# Patient Record
Sex: Female | Born: 1990 | Race: White | Hispanic: No | Marital: Single | Smoking: Former smoker
Health system: Southern US, Community
[De-identification: ages and names within clinical notes are randomized; demographics above are authoritative.]

## PROBLEM LIST (undated history)

## (undated) ENCOUNTER — Inpatient Hospital Stay: Payer: Self-pay

## (undated) DIAGNOSIS — O24419 Gestational diabetes mellitus in pregnancy, unspecified control: Secondary | ICD-10-CM

## (undated) HISTORY — PX: NO PAST SURGERIES: SHX2092

---
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
## 2012-04-14 ENCOUNTER — Observation Stay: Payer: Self-pay

## 2012-04-17 ENCOUNTER — Observation Stay: Payer: Self-pay | Admitting: Obstetrics & Gynecology

## 2012-04-19 ENCOUNTER — Observation Stay: Payer: Self-pay | Admitting: Obstetrics and Gynecology

## 2012-04-21 ENCOUNTER — Inpatient Hospital Stay: Payer: Self-pay | Admitting: Obstetrics and Gynecology

## 2012-04-21 LAB — PIH PROFILE
Anion Gap: 13 (ref 7–16)
Calcium, Total: 9.2 mg/dL (ref 8.5–10.1)
Chloride: 104 mmol/L (ref 98–107)
Co2: 20 mmol/L — ABNORMAL LOW (ref 21–32)
EGFR (African American): >60
HCT: 40.3 % (ref 35.0–47.0)
Osmolality: 271 (ref 275–301)
Potassium: 3.9 mmol/L (ref 3.5–5.1)
RDW: 15.6 % — ABNORMAL HIGH (ref 11.5–14.5)
SGOT(AST): 26 U/L (ref 15–37)
Sodium: 137 mmol/L (ref 136–145)
WBC: 11.5 10*3/uL — ABNORMAL HIGH (ref 3.6–11.0)

## 2012-04-21 LAB — PROTEIN / CREATININE RATIO, URINE
Creatinine, Urine: 166.3 mg/dL — ABNORMAL HIGH (ref 30.0–125.0)
Protein, Random Urine: 28 mg/dL — ABNORMAL HIGH (ref 0–12)

## 2014-07-31 NOTE — H&P (Signed)
L&D Evaluation:  History Expanded:   HPI [age] yo [facility] and 1st trimester US, presenting with contractions since this am. Pt was checked in office yesterday and was dilated 1 cm, membranes stripped. Decreased FM, no LOF, no VB.   [facility]  A+ / VZNI / RPR NR / HIV neg / 1-hr 126 / GBS negative   TDAP given 02/15/2012, influenza given 01/04/2012    Blood Type (Maternal) A positive    Maternal HIV Negative    Maternal Syphilis Ab Nonreactive    Maternal Varicella Non-Immune    Rubella Results (Maternal) immune    Maternal T-Dap Immune    Presents with contractions    Patient's Medical History No Chronic Illness    Patient's Surgical History none    Medications Pre Natal Vitamins    Allergies NKDA    Social History none  former tobacco   ROS:   ROS All systems were reviewed.  HEENT, CNS, GI, GU, Respiratory, CV, Renal and Musculoskeletal systems were found to be normal.   Exam:   Vital Signs 130s/90s, 142/86    General appears uncomfortable with contractions    Mental Status clear    Chest clear    Heart no murmur/gallop/rubs    Abdomen gravid, tender with contractions    Estimated Fetal Weight Large for gestational age, 8-8.5 #    Fetal Position vtx    Edema no edema    Reflexes 2+    Pelvic 2/75/-1 per RN    Mebranes Intact    FHT Baseline 140, min variability, <15x15 accels, no decels    FHT Description Decreased Variability    Ucx regular, q 2-4 minutes    Skin dry   Impression:   Impression early labor   Plan:   Plan monitor contractions and for cervical change    Comments IV Fluids Pre-e & admission labs    Follow Up Appointment already scheduled   Electronic Signatures: Vella KohlerBrothers, Cashmere Harmes K (CNM)  (Signed 30-Jan-14 08:17)  Authored: L&D Evaluation   Last Updated: 30-Jan-14 08:17 by Vella KohlerBrothers, Billijo Dilling K (CNM)

## 2014-07-31 NOTE — H&P (Signed)
L&D Evaluation:  History:   HPI 24 yo [facility] with contractions.  They have been off an on for the last week.  Increased in frequency to every 1 minutes lasting 1 minute per patient.  +FM, no LOF, no VB  [facility]  A+ / VZNI / RPR NR / HIV neg / 1-hr 126 / GBS negative   TDAP given 02/15/2012, influenza given 01/04/2012    Presents with contractions    Patient's Medical History No Chronic Illness    Patient's Surgical History none    Medications Pre Natal Vitamins    Allergies NKDA    Social History none   ROS:   ROS All systems were reviewed.  HEENT, CNS, GI, GU, Respiratory, CV, Renal and Musculoskeletal systems were found to be normal.   Exam:   Vital Signs stable    Urine Protein not completed    General no apparent distress    Chest no increased work of breathing    Abdomen gravid, non-tender    Estimated Fetal Weight Average for gestational age    Fetal Position vtx    Edema no edema    Pelvic no external lesions, 1/50/-3    Mebranes Intact    FHT normal rate with no decels, negative ctx stress test    Ucx irregular, initially ever 2 minutes spaced out to irregular 5-8 min   Impression:   Impression Braxton Hicks Contractions   Plan:   Plan EFM/NST    Comments No cerivcal change over 2-hrs of monitorin, less than 39 weeks so no augmentation.  Given ambien 10mg  tab 1 tab po qhs prn insomnia.  [facility]    Follow Up Appointment already scheduled   Electronic Signatures: Lorrene ReidStaebler, Kasey Hansell M (MD)  (Signed 28-Jan-14 23:59)  Authored: L&D Evaluation   Last Updated: 28-Jan-14 23:59 by Lorrene ReidStaebler, Pepper Kerrick M (MD)

## 2015-04-29 ENCOUNTER — Emergency Department: Payer: No Typology Code available for payment source

## 2015-04-29 ENCOUNTER — Emergency Department
Admission: EM | Admit: 2015-04-29 | Discharge: 2015-04-29 | Disposition: A | Discharge status: Home / Self Care | Payer: No Typology Code available for payment source | Attending: Emergency Medicine | Admitting: Emergency Medicine

## 2015-04-29 ENCOUNTER — Encounter: Payer: Self-pay | Admitting: Emergency Medicine

## 2015-04-29 DIAGNOSIS — F17200 Nicotine dependence, unspecified, uncomplicated: Secondary | ICD-10-CM | POA: Diagnosis not present

## 2015-04-29 DIAGNOSIS — S134XXA Sprain of ligaments of cervical spine, initial encounter: Secondary | ICD-10-CM | POA: Diagnosis not present

## 2015-04-29 DIAGNOSIS — Y92410 Unspecified street and highway as the place of occurrence of the external cause: Secondary | ICD-10-CM | POA: Insufficient documentation

## 2015-04-29 DIAGNOSIS — Y998 Other external cause status: Secondary | ICD-10-CM | POA: Diagnosis not present

## 2015-04-29 DIAGNOSIS — Y9389 Activity, other specified: Secondary | ICD-10-CM | POA: Diagnosis not present

## 2015-04-29 DIAGNOSIS — S199XXA Unspecified injury of neck, initial encounter: Secondary | ICD-10-CM | POA: Diagnosis present

## 2015-04-29 MED ORDER — OXYCODONE-ACETAMINOPHEN 5-325 MG PO TABS
1.0000 | ORAL_TABLET | Freq: Once | ORAL | Status: AC
  Administered 2015-04-29: 1 via ORAL
  Filled 2015-04-29: qty 1
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
## 2015-04-29 NOTE — ED Notes (Signed)
C-collar removed by MD, sister in law at bedside.

## 2015-04-30 ENCOUNTER — Ambulatory Visit
Admission: EM | Admit: 2015-04-30 | Discharge: 2015-04-30 | Disposition: A | Discharge status: Home / Self Care | Payer: Self-pay | Attending: Family Medicine | Admitting: Family Medicine

## 2015-04-30 DIAGNOSIS — M6248 Contracture of muscle, other site: Secondary | ICD-10-CM

## 2015-04-30 DIAGNOSIS — M62838 Other muscle spasm: Secondary | ICD-10-CM

## 2015-04-30 DIAGNOSIS — S134XXA Sprain of ligaments of cervical spine, initial encounter: Secondary | ICD-10-CM

## 2015-04-30 MED ORDER — ONDANSETRON 8 MG PO TBDP: 8.0000 mg | ORAL_TABLET | Freq: Three times a day (TID) | ORAL | Status: DC | PRN

## 2015-04-30 MED ORDER — MELOXICAM 15 MG PO TABS: 15.0000 mg | ORAL_TABLET | Freq: Every day | ORAL | Status: DC

## 2015-04-30 MED ORDER — HYDROCODONE-ACETAMINOPHEN 5-325 MG PO TABS: 1.0000 | ORAL_TABLET | Freq: Three times a day (TID) | ORAL | Status: DC | PRN

## 2015-04-30 MED ORDER — KETOROLAC TROMETHAMINE 60 MG/2ML IM SOLN
60.0000 mg | Freq: Once | INTRAMUSCULAR | Status: AC
  Administered 2015-04-30: 60 mg via INTRAMUSCULAR

## 2015-04-30 MED ORDER — ORPHENADRINE CITRATE ER 100 MG PO TB12: 100.0000 mg | ORAL_TABLET | Freq: Two times a day (BID) | ORAL | Status: DC
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
## 2015-04-30 NOTE — Discharge Instructions (Signed)
Cervical Sprain A cervical sprain is when the tissues (ligaments) that hold the neck bones in place stretch or tear. HOME CARE   Put ice on the injured area.  Put ice in a plastic bag.  Place a towel between your skin and the bag.  Leave the ice on for 15-20 minutes, 3-4 times a day.  You may have been given a collar to wear. This collar keeps your neck from moving while you heal.  Do not take the collar off unless told by your doctor.  If you have long hair, keep it outside of the collar.  Ask your doctor before changing the position of your collar. You may need to change its position over time to make it more comfortable.  If you are allowed to take off the collar for cleaning or bathing, follow your doctor's instructions on how to do it safely.  Keep your collar clean by wiping it with mild soap and water. Dry it completely. If the collar has removable pads, remove them every 1-2 days to hand wash them with soap and water. Allow them to air dry. They should be dry before you wear them in the collar.  Do not drive while wearing the collar.  Only take medicine as told by your doctor.  Keep all doctor visits as told.  Keep all physical therapy visits as told.  Adjust your work station so that you have good posture while you work.  Avoid positions and activities that make your problems worse.  Warm up and stretch before being active. GET HELP IF:  Your pain is not controlled with medicine.  You cannot take less pain medicine over time as planned.  Your activity level does not improve as expected. GET HELP RIGHT AWAY IF:   You are bleeding.  Your stomach is upset.  You have an allergic reaction to your medicine.  You develop new problems that you cannot explain.  You lose feeling (become numb) or you cannot move any part of your body (paralysis).  You have tingling or weakness in any part of your body.  Your symptoms get worse. Symptoms include:  Pain,  soreness, stiffness, puffiness (swelling), or a burning feeling in your neck.  Pain when your neck is touched.  Shoulder or upper back pain.  Limited ability to move your neck.  Headache.  Dizziness.  Your hands or arms feel week, lose feeling, or tingle.  Muscle spasms.  Difficulty swallowing or chewing. MAKE SURE YOU:   Understand these instructions.  Will watch your condition.  Will get help right away if you are not doing well or get worse.   This information is not intended to replace advice given to you by your health care provider. Make sure you discuss any questions you have with your health care provider.   Document Released: 08/26/2007 Document Revised: 11/09/2012 Document Reviewed: 09/14/2012 Elsevier Interactive Patient Education 2016 [place].  Cryotherapy Cryotherapy means treatment with cold. Ice or gel packs can be used to reduce both pain and swelling. Ice is the most helpful within the first 24 to 48 hours after an injury or flare-up from overusing a muscle or joint. Sprains, strains, spasms, burning pain, shooting pain, and aches can all be eased with ice. Ice can also be used when recovering from surgery. Ice is effective, has very few side effects, and is safe for most people to use. PRECAUTIONS  Ice is not a safe treatment option for people with:  Raynaud phenomenon. This is  a condition affecting small blood vessels in the extremities. Exposure to cold may cause your problems to return. °· Cold hypersensitivity. There are many forms of cold hypersensitivity, including: °¨ Cold urticaria. Red, itchy hives appear on the skin when the tissues begin to warm after being iced. °¨ Cold erythema. This is a red, itchy rash caused by exposure to cold. °¨ Cold hemoglobinuria. Red blood cells break down when the tissues begin to warm after being iced. The hemoglobin that carry oxygen are passed into the urine because they cannot combine with blood proteins fast  enough. °· Numbness or altered sensitivity in the area being iced. °If you have any of the following conditions, do not use ice until you have discussed cryotherapy with your caregiver: °· Heart conditions, such as arrhythmia, angina, or chronic heart disease. °· High blood pressure. °· Healing wounds or open skin in the area being iced. °· Current infections. °· Rheumatoid arthritis. °· Poor circulation. °· Diabetes. °Ice slows the blood flow in the region it is applied. This is beneficial when trying to stop inflamed tissues from spreading irritating chemicals to surrounding tissues. However, if you expose your skin to cold temperatures for too long or without the proper protection, you can damage your skin or nerves. Watch for signs of skin damage due to cold. °HOME CARE INSTRUCTIONS °Follow these tips to use ice and cold packs safely. °· Place a dry or damp towel between the ice and skin. A damp towel will cool the skin more quickly, so you may need to shorten the time that the ice is used. °· For a more rapid response, add gentle compression to the ice. °· Ice for no more than 10 to 20 minutes at a time. The bonier the area you are icing, the less time it will take to get the benefits of ice. °· Check your skin after 5 minutes to make sure there are no signs of a poor response to cold or skin damage. °· Rest 20 minutes or more between uses. °· Once your skin is numb, you can end your treatment. You can test numbness by very lightly touching your skin. The touch should be so light that you do not see the skin dimple from the pressure of your fingertip. When using ice, most people will feel these normal sensations in this order: cold, burning, aching, and numbness. °· Do not use ice on someone who cannot communicate their responses to pain, such as small children or people with dementia. °HOW TO MAKE AN ICE PACK °Ice packs are the most common way to use ice therapy. Other methods include ice massage, ice baths,  and cryosprays. Muscle creams that cause a cold, tingly feeling do not offer the same benefits that ice offers and should not be used as a substitute unless recommended by your caregiver. °To make an ice pack, do one of the following: °· Place crushed ice or a bag of frozen vegetables in a sealable plastic bag. Squeeze out the excess air. Place this bag inside another plastic bag. Slide the bag into a pillowcase or place a damp towel between your skin and the bag. °· Mix 3 parts water with 1 part rubbing alcohol. Freeze the mixture in a sealable plastic bag. When you remove the mixture from the freezer, it will be slushy. Squeeze out the excess air. Place this bag inside another plastic bag. Slide the bag into a pillowcase or place a damp towel between your skin and the bag. °SEEK   MEDICAL CARE IF:  You develop white spots on your skin. This may give the skin a blotchy (mottled) appearance.  Your skin turns blue or pale.  Your skin becomes waxy or hard.  Your swelling gets worse. MAKE SURE YOU:   Understand these instructions.  Will watch your condition.  Will get help right away if you are not doing well or get worse.   This information is not intended to replace advice given to you by your health care provider. Make sure you discuss any questions you have with your health care provider.   Document Released: 11/03/2010 Document Revised: 03/30/2014 Document Reviewed: 11/03/2010 Elsevier Interactive Patient Education 2016 [employer].  Acute Torticollis Torticollis is a condition in which the muscles of the neck tighten (contract) abnormally, causing the neck to twist and the head to move into an unnatural position. Torticollis that develops suddenly is called acute torticollis. If torticollis becomes chronic and is left untreated, the face and neck can become deformed. CAUSES This condition may be caused by:  Sleeping in an awkward position (common).  Extending or twisting the neck  muscles beyond their normal position.  Infection. In some cases, the cause may not be known. SYMPTOMS Symptoms of this condition include:  An unnatural position of the head.  Neck pain.  A limited ability to move the neck.  Twisting of the neck to one side. DIAGNOSIS This condition is diagnosed with a physical exam. You may also have imaging tests, such as an X-ray, CT scan, or MRI. TREATMENT Treatment for this condition involves trying to relax the neck muscles. It may include:  Medicines or shots.  Physical therapy.  Surgery. This may be done in severe cases. HOME CARE INSTRUCTIONS  Take medicines only as directed by your health care provider.  Do stretching exercises and massage your neck as directed by your health care provider.  Keep all follow-up visits as directed by your health care provider. This is important. SEEK MEDICAL CARE IF:  You develop a fever. SEEK IMMEDIATE MEDICAL CARE IF:  You develop difficulty breathing.  You develop noisy breathing (stridor).  You start drooling.  You have trouble swallowing or have pain with swallowing.  You develop numbness or weakness in your hands or feet.  You have changes in your speech, understanding, or vision.  Your pain gets worse.   This information is not intended to replace advice given to you by your health care provider. Make sure you discuss any questions you have with your health care provider.   Document Released: 03/06/2000 Document Revised: 07/24/2014 Document Reviewed: 03/05/2014 Elsevier Interactive Patient Education 2016 [employer].  Tourist information centre manager After a car crash (motor vehicle collision), it is normal to have bruises and sore muscles. The first 24 hours usually feel the worst. After that, you will likely start to feel better each day. HOME CARE  Put ice on the injured area.  Put ice in a plastic bag.  Place a towel between your skin and the bag.  Leave the ice on for 15-20  minutes, 03-04 times a day.  Drink enough fluids to keep your pee (urine) clear or pale yellow.  Do not drink alcohol.  Take a warm shower or bath 1 or 2 times a day. This helps your sore muscles.  Return to activities as told by your doctor. Be careful when lifting. Lifting can make neck or back pain worse.  Only take medicine as told by your doctor. Do not use aspirin. GET  HELP RIGHT AWAY IF:   Your arms or legs tingle, feel weak, or lose feeling (numbness).  You have headaches that do not get better with medicine.  You have neck pain, especially in the middle of the back of your neck.  You cannot control when you pee (urinate) or poop (bowel movement).  Pain is getting worse in any part of your body.  You are short of breath, dizzy, or pass out (faint).  You have chest pain.  You feel sick to your stomach (nauseous), throw up (vomit), or sweat.  You have belly (abdominal) pain that gets worse.  There is blood in your pee, poop, or throw up.  You have pain in your shoulder (shoulder strap areas).  Your problems are getting worse. MAKE SURE YOU:   Understand these instructions.  Will watch your condition.  Will get help right away if you are not doing well or get worse.   This information is not intended to replace advice given to you by your health care provider. Make sure you discuss any questions you have with your health care provider.   Document Released: 08/26/2007 Document Revised: 06/01/2011 Document Reviewed: 08/06/2010 Elsevier Interactive Patient Education [employer]

## 2015-05-05 ENCOUNTER — Emergency Department
Admission: EM | Admit: 2015-05-05 | Discharge: 2015-05-05 | Disposition: A | Discharge status: Home / Self Care | Payer: No Typology Code available for payment source | Attending: Emergency Medicine | Admitting: Emergency Medicine

## 2015-05-05 ENCOUNTER — Emergency Department: Payer: No Typology Code available for payment source

## 2015-05-05 DIAGNOSIS — Z791 Long term (current) use of non-steroidal anti-inflammatories (NSAID): Secondary | ICD-10-CM | POA: Insufficient documentation

## 2015-05-05 DIAGNOSIS — Z79899 Other long term (current) drug therapy: Secondary | ICD-10-CM | POA: Insufficient documentation

## 2015-05-05 DIAGNOSIS — S161XXA Strain of muscle, fascia and tendon at neck level, initial encounter: Secondary | ICD-10-CM | POA: Diagnosis not present

## 2015-05-05 DIAGNOSIS — S0990XA Unspecified injury of head, initial encounter: Secondary | ICD-10-CM | POA: Diagnosis not present

## 2015-05-05 DIAGNOSIS — Y92410 Unspecified street and highway as the place of occurrence of the external cause: Secondary | ICD-10-CM | POA: Insufficient documentation

## 2015-05-05 DIAGNOSIS — Y9389 Activity, other specified: Secondary | ICD-10-CM | POA: Insufficient documentation

## 2015-05-05 DIAGNOSIS — F17200 Nicotine dependence, unspecified, uncomplicated: Secondary | ICD-10-CM | POA: Insufficient documentation

## 2015-05-05 DIAGNOSIS — S199XXA Unspecified injury of neck, initial encounter: Secondary | ICD-10-CM | POA: Diagnosis present

## 2015-05-05 DIAGNOSIS — Y998 Other external cause status: Secondary | ICD-10-CM | POA: Diagnosis not present

## 2015-05-05 MED ORDER — OXYCODONE-ACETAMINOPHEN 5-325 MG PO TABS
1.0000 | ORAL_TABLET | Freq: Once | ORAL | Status: AC
  Administered 2015-05-05: 1 via ORAL
  Filled 2015-05-05: qty 1

## 2015-05-05 MED ORDER — OXYCODONE-ACETAMINOPHEN 5-325 MG PO TABS: 1.0000 | ORAL_TABLET | Freq: Four times a day (QID) | ORAL | Status: DC | PRN

## 2015-05-05 NOTE — ED Notes (Signed)
Pt taken to CT.

## 2015-05-05 NOTE — Discharge Instructions (Signed)
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
## 2015-05-05 NOTE — ED Notes (Signed)
Report to Rachel, RN.
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
## 2015-07-11 ENCOUNTER — Encounter: Payer: Self-pay | Admitting: Gynecology

## 2015-07-11 ENCOUNTER — Ambulatory Visit
Admission: EM | Admit: 2015-07-11 | Discharge: 2015-07-11 | Disposition: A | Discharge status: Home / Self Care | Payer: No Typology Code available for payment source | Attending: Family Medicine | Admitting: Family Medicine

## 2015-07-11 DIAGNOSIS — H109 Unspecified conjunctivitis: Secondary | ICD-10-CM

## 2015-07-11 DIAGNOSIS — H05012 Cellulitis of left orbit: Secondary | ICD-10-CM

## 2015-07-11 DIAGNOSIS — J0100 Acute maxillary sinusitis, unspecified: Secondary | ICD-10-CM

## 2015-07-11 LAB — RAPID STREP SCREEN (MED CTR MEBANE ONLY): Streptococcus, Group A Screen (Direct): NEGATIVE

## 2015-07-11 MED ORDER — SUPRAX 400 MG PO TABS: 400.0000 mg | ORAL_TABLET | Freq: Every day | ORAL | Status: DC

## 2015-07-11 MED ORDER — HYDROCOD POLST-CPM POLST ER 10-8 MG/5ML PO SUER: 5.0000 mL | Freq: Two times a day (BID) | ORAL | Status: DC | PRN

## 2015-07-11 MED ORDER — FEXOFENADINE-PSEUDOEPHED ER 180-240 MG PO TB24: 1.0000 | ORAL_TABLET | Freq: Every day | ORAL | Status: DC

## 2015-07-11 MED ORDER — CEFTRIAXONE SODIUM 1 G IJ SOLR
1.0000 g | Freq: Once | INTRAMUSCULAR | Status: AC
  Administered 2015-07-11: 1 g via INTRAMUSCULAR

## 2015-07-11 NOTE — ED Provider Notes (Signed)
CSN: [id number]     Arrival date & time 07/11/15  1952 History   First MD Initiated Contact with Patient 07/11/15 2057    Nurses notes were reviewed.  Chief Complaint  Patient presents with  . Sore Throat  . URI  . Conjunctivitis   Patient is here because of coughing for 2 weeks sinus congestion and nasal drainage. She also reports having sore throat for about a week. She states her sputum is also productive greenish as per chronic problem. She also reports left eye she will this morning with a swollen red inflamed itchy. She does not remember or recall anything getting into her eye and our spine last night before she went to bed.  She does not have any drug allergies unfortunately she does smoke and she was warned that she needs to stop smoking. There is a history of both grandparents having breast cancer. No other significant or pertinent medical family history.  (Consider location/radiation/quality/duration/timing/severity/associated sxs/prior Treatment) Patient is a 25 y.o. female presenting with pharyngitis, URI, and conjunctivitis. The history is provided by the patient. No language interpreter was used.  Sore Throat This is a new problem. The current episode started more than 1 week ago. Associated symptoms include shortness of breath. Nothing aggravates the symptoms. Nothing relieves the symptoms. She has tried nothing for the symptoms. The treatment provided no relief.  URI Presenting symptoms: congestion, cough and rhinorrhea   Congestion:    Location:  Chest and nasal Severity:  Moderate Duration:  2 weeks Timing:  Constant Relieved by:  Nothing Associated symptoms: sinus pain   Conjunctivitis This is a new problem. The current episode started 6 to 12 hours ago. The problem occurs constantly. Associated symptoms include shortness of breath. Nothing aggravates the symptoms. She has tried nothing for the symptoms. The treatment provided no relief.    History reviewed. No  pertinent past medical history. History reviewed. No pertinent past surgical history. No family history on file. Social History  Substance Use Topics  . Smoking status: Current Every Day Smoker -- 0.50 packs/day    Types: Cigarettes  . Smokeless tobacco: None  . Alcohol Use: Yes     Comment: socially   OB History    No data available     Review of Systems  HENT: Positive for congestion and rhinorrhea.   Eyes: Positive for pain, discharge and itching.  Respiratory: Positive for cough and shortness of breath.   Skin: Negative for rash.  All other systems reviewed and are negative.   Allergies  Review of patient's allergies indicates no known allergies.  Home Medications   Prior to Admission medications   Medication Sig Start Date End Date Taking? Authorizing Provider  chlorpheniramine-HYDROcodone (TUSSIONEX PENNKINETIC ER) 10-8 MG/5ML SUER Take 5 mLs by mouth every 12 (twelve) hours as needed for cough. 07/11/15   Hassan RowanEugene Erisa Mehlman, MD  fexofenadine-pseudoephedrine (ALLEGRA-D ALLERGY & CONGESTION) 180-240 MG 24 hr tablet Take 1 tablet by mouth daily. 07/11/15   Hassan RowanEugene Hara Milholland, MD  HYDROcodone-acetaminophen (NORCO) 5-325 MG tablet Take 1 tablet by mouth every 8 (eight) hours as needed for moderate pain. 04/30/15   Hassan RowanEugene Maegen Wigle, MD  ibuprofen (ADVIL,MOTRIN) 800 MG tablet Take 800 mg by mouth every 8 (eight) hours as needed.    Historical Provider, MD  levonorgestrel (MIRENA) 20 MCG/24HR IUD 1 each by Intrauterine route once.    Historical Provider, MD  meloxicam (MOBIC) 15 MG tablet Take 1 tablet (15 mg total) by mouth daily. 04/30/15   Hassan RowanEugene Augusto Deckman,  MD  ondansetron (ZOFRAN ODT) 8 MG disintegrating tablet Take 1 tablet (8 mg total) by mouth every 8 (eight) hours as needed for nausea or vomiting. 04/30/15   Hassan Rowan, MD  orphenadrine (NORFLEX) 100 MG tablet Take 1 tablet (100 mg total) by mouth 2 (two) times daily. 04/30/15   Hassan Rowan, MD  oxyCODONE-acetaminophen (ROXICET) 5-325 MG tablet Take 1  tablet by mouth every 6 (six) hours as needed. 05/05/15   Minna Antis, MD  SUPRAX 400 MG tablet Take 1 tablet (400 mg total) by mouth daily. If less expensive for better coverage may take 200 mg 2 of the chewable tablets for 10 days #20 07/11/15   Hassan Rowan, MD   Meds Ordered and Administered this Visit   Medications  cefTRIAXone (ROCEPHIN) injection 1 g (1 g Intramuscular Given 07/11/15 2125)    BP 125/99 mmHg  Pulse 93  Temp(Src) 98.6 F (37 C) (Oral)  Resp 16  Ht  (1.549 m)  Wt 180 lb (81.647 kg)  BMI 34.03 kg/m2  SpO2 100%  LMP  No data found.   Physical Exam  Constitutional: She is oriented to person, place, and time. She appears well-developed and well-nourished.  HENT:  Head: Normocephalic and atraumatic.  Right Ear: External ear normal.  Left Ear: External ear normal.  Eyes: Conjunctivae are normal. Pupils are equal, round, and reactive to light. Right eye exhibits no discharge.  Neck: Normal range of motion. Neck supple.  Cardiovascular: Normal rate and regular rhythm.   Pulmonary/Chest: Effort normal.  Musculoskeletal: Normal range of motion. She exhibits no tenderness.  Lymphadenopathy:    She has cervical adenopathy.  Neurological: She is alert and oriented to person, place, and time.  Skin: Skin is warm.  Psychiatric: She has a normal mood and affect. Her behavior is normal.  Vitals reviewed.   ED Course  Procedures (including critical care time)  Labs Review Labs Reviewed  RAPID STREP SCREEN (NOT AT [facility])  CULTURE, GROUP A STREP [facility])    Imaging Review No results found.   Visual Acuity Review  Right Eye Distance:   Left Eye Distance:   Bilateral Distance:    Right Eye Near: R Near: 20/20 Left Eye Near:  L Near: 20/40 Bilateral Near:  20/20 (without corrective)   Results for orders placed or performed during the hospital encounter of 07/11/15  Rapid strep screen  Result Value Ref Range   Streptococcus, Group A Screen (Direct)  NEGATIVE NEGATIVE     MDM   1. Orbital cellulitis on left   2. Acute maxillary sinusitis, recurrence not specified   3. Conjunctivitis of left eye    We'll place on Suprax 400 mg 1 tablet a day, Allegra-D 1 tablet daily Flonase nasal spray 2 puffs each nostril daily as well. We'll recommend she follow up with her PCP in one week to 10 days. Follow-up here in 48 hours or go to the ED if fever occurs or symptoms become worse. Explained to her the importance of treating orbital cellulitis and take serious. Will give her a gram of Rocephin here tonight.  Note: This dictation was prepared with Dragon dictation along with smaller phrase technology. Any transcriptional errors that result from this process are unintentional.  Hassan Rowan, MD 07/11/15 2137

## 2015-07-11 NOTE — ED Notes (Signed)
Patient c/o left eye conjunctivitis x this am. Patient also stated coughing/ nasal congestion /drip x 1 week.

## 2015-07-11 NOTE — Discharge Instructions (Signed)
Orbital Cellulitis Orbital cellulitis is an infection in the eye socket (orbit) and the tissues that surround the eye. The infection can spread to the eyelids, eyebrow area, and cheek. It can also cause a pocket of pus to develop around the eye (orbital abscess). In severe cases, the infection can spread to the brain. Orbital cellulitis is a medical emergency. CAUSES The most common cause of this condition is a bacterial infection. The infection usually spreads to the eye socket from another part of the body. The infection may start in:  The nose or sinuses.  The eyelids.  Facial skin.  The bloodstream. RISK FACTORS This condition is more likely to develop in people who have recently had one of the following:  Upper respiratory infection.  Sinus infection.  Eyelid or facial infection.  Eye injury.  Infection that affects the entire body or the bloodstream (systemic infection). SYMPTOMS Symptoms of this condition usually start quickly. Symptoms include:  Eye pain that gets worse with eye movement.  Swelling around the eye.  Eye redness.  Bulging of the eye.  Inability to move the eye.  Double vision.  Fever. DIAGNOSIS This condition may be diagnosed based on your symptoms and an eye exam. You may also have tests to confirm the diagnosis and to check for an orbital abscess. Other tests (cultures) may be done to find out what type of bacteria is causing the infection. Tests may include:  Complete blood count (CBC).  Blood culture.  Nose, sinus, or throat culture.  Imaging studies such as a CT scan or MRI. TREATMENT This condition is usually treated in a hospital. Antibiotic medicines are given directly into a vein through an IV tube.  At first, you may get IV antibiotics to kill bacteria that often cause orbital cellulitis (broad spectrum antibiotics).  Your medicine may be changed if cultures suggest that another antibiotic would be better.  If the IV  antibiotics are working to treat your infection, you may be switched to oral antibiotics and allowed to go home.  In some cases, surgery may be needed to drain an orbital abscess. HOME CARE INSTRUCTIONS  Take medicines only as directed by your health care provider.  Take your antibiotic medicine as directed by your health care provider. Finish the antibiotic even if you start to feel better.  Return to your normal activities as directed by your health care provider. Ask your health care provider what activities are safe for you.  Keep all follow-up visits as directed by your health care provider. This is important. SEEK IMMEDIATE MEDICAL CARE IF:  Your eye pain or swelling returns or it gets worse.  You have any changes in your vision.  You have a fever.   This information is not intended to replace advice given to you by your health care provider. Make sure you discuss any questions you have with your health care provider.   Document Released: 03/03/2001 Document Revised: 07/24/2014 Document Reviewed: 03/05/2014 Elsevier Interactive Patient Education 2016 Elsevier Inc.  Sinusitis, Adult Sinusitis is redness, soreness, and puffiness (inflammation) of the air pockets in the bones of your face (sinuses). The redness, soreness, and puffiness can cause air and mucus to get trapped in your sinuses. This can allow germs to grow and cause an infection.  HOME CARE   Drink enough fluids to keep your pee (urine) clear or pale yellow.  Use a humidifier in your home.  Run a hot shower to create steam in the bathroom. Sit in the bathroom  with the door closed. Breathe in the steam 3-4 times a day.  Put a warm, moist washcloth on your face 3-4 times a day, or as told by your doctor.  Use salt water sprays (saline sprays) to wet the thick fluid in your nose. This can help the sinuses drain.  Only take medicine as told by your doctor. GET HELP RIGHT AWAY IF:   Your pain gets worse.  You  have very bad headaches.  You are sick to your stomach (nauseous).  You throw up (vomit).  You are very sleepy (drowsy) all the time.  Your face is puffy (swollen).  Your vision changes.  You have a stiff neck.  You have trouble breathing. MAKE SURE YOU:   Understand these instructions.  Will watch your condition.  Will get help right away if you are not doing well or get worse.   This information is not intended to replace advice given to you by your health care provider. Make sure you discuss any questions you have with your health care provider.   Document Released: 08/26/2007 Document Revised: 03/30/2014 Document Reviewed: 10/13/2011 Elsevier Interactive Patient Education [employer] Elsevier Inc.

## 2015-07-12 ENCOUNTER — Telehealth: Payer: Self-pay | Admitting: Emergency Medicine

## 2015-07-12 NOTE — ED Notes (Signed)
Entry on 07/12/15: Pt called, stated she could not afford Suprex prescription, she did not have health insurance, and asked for less expensive medication. Per Dr. Thurmond ButtsWade, prescription for Augmentin 875 mg, 2x day, for 10 days.

## 2015-07-13 LAB — CULTURE, GROUP A STREP (THRC)

## 2015-07-16 ENCOUNTER — Telehealth: Payer: Self-pay | Admitting: *Deleted

## 2015-07-16 NOTE — ED Notes (Signed)
Called patient, significant other answered. Informed him that patient strep culture came back negative, but to continue taking antibiotic.

## 2015-07-26 ENCOUNTER — Ambulatory Visit
Admission: EM | Admit: 2015-07-26 | Discharge: 2015-07-26 | Disposition: A | Discharge status: Home / Self Care | Payer: No Typology Code available for payment source | Attending: Family Medicine | Admitting: Family Medicine

## 2015-07-26 ENCOUNTER — Encounter: Payer: Self-pay | Admitting: Emergency Medicine

## 2015-07-26 DIAGNOSIS — H109 Unspecified conjunctivitis: Secondary | ICD-10-CM

## 2015-07-26 DIAGNOSIS — R05 Cough: Secondary | ICD-10-CM

## 2015-07-26 DIAGNOSIS — R059 Cough, unspecified: Secondary | ICD-10-CM

## 2015-07-26 MED ORDER — GUAIFENESIN-CODEINE 100-10 MG/5ML PO SOLN: ORAL | Status: DC

## 2015-07-26 MED ORDER — MOXIFLOXACIN HCL 0.5 % OP SOLN: 1.0000 [drp] | Freq: Three times a day (TID) | OPHTHALMIC | Status: DC

## 2015-07-26 NOTE — ED Provider Notes (Signed)
CSN: [id number]     Arrival date & time 07/26/15  1158 History   First MD Initiated Contact with Patient 07/26/15 1327     Chief Complaint  Patient presents with  . Cough  . Eye Problem   (Consider location/radiation/quality/duration/timing/severity/associated sxs/prior Treatment) HPI Comments: 25 yo female with a c/o 2 days of eye drainage and redness. Also c/o cough worse at night. Denies any fevers, chills, sore throat, nasal congestion, wheezing, chest pain, shortness of breath. Was treated with Augmentin for sinusitis about 3 weeks ago.   The history is provided by the patient.    History reviewed. No pertinent past medical history. History reviewed. No pertinent past surgical history. History reviewed. No pertinent family history. Social History  Substance Use Topics  . Smoking status: Current Every Day Smoker -- 0.50 packs/day    Types: Cigarettes  . Smokeless tobacco: None  . Alcohol Use: Yes     Comment: socially   OB History    No data available     Review of Systems  Allergies  Review of patient's allergies indicates no known allergies.  Home Medications   Prior to Admission medications   Medication Sig Start Date End Date Taking? Authorizing Provider  chlorpheniramine-HYDROcodone (TUSSIONEX PENNKINETIC ER) 10-8 MG/5ML SUER Take 5 mLs by mouth every 12 (twelve) hours as needed for cough. 07/11/15   Hassan RowanEugene Wade, MD  fexofenadine-pseudoephedrine (ALLEGRA-D ALLERGY & CONGESTION) 180-240 MG 24 hr tablet Take 1 tablet by mouth daily. 07/11/15   Hassan RowanEugene Wade, MD  guaiFENesin-codeine 100-10 MG/5ML syrup 10 ml po qhs prn 07/26/15   Payton Mccallumrlando Daily Crate, MD  HYDROcodone-acetaminophen (NORCO) 5-325 MG tablet Take 1 tablet by mouth every 8 (eight) hours as needed for moderate pain. 04/30/15   Hassan RowanEugene Wade, MD  ibuprofen (ADVIL,MOTRIN) 800 MG tablet Take 800 mg by mouth every 8 (eight) hours as needed.    Historical Provider, MD  levonorgestrel (MIRENA) 20 MCG/24HR IUD 1 each by  Intrauterine route once.    Historical Provider, MD  meloxicam (MOBIC) 15 MG tablet Take 1 tablet (15 mg total) by mouth daily. 04/30/15   Hassan RowanEugene Wade, MD  moxifloxacin (VIGAMOX) 0.5 % ophthalmic solution Place 1 drop into both eyes 3 (three) times daily. 07/26/15   Payton Mccallumrlando Bahja Bence, MD  ondansetron (ZOFRAN ODT) 8 MG disintegrating tablet Take 1 tablet (8 mg total) by mouth every 8 (eight) hours as needed for nausea or vomiting. 04/30/15   Hassan RowanEugene Wade, MD  orphenadrine (NORFLEX) 100 MG tablet Take 1 tablet (100 mg total) by mouth 2 (two) times daily. 04/30/15   Hassan RowanEugene Wade, MD  oxyCODONE-acetaminophen (ROXICET) 5-325 MG tablet Take 1 tablet by mouth every 6 (six) hours as needed. 05/05/15   Minna AntisKevin Paduchowski, MD  SUPRAX 400 MG tablet Take 1 tablet (400 mg total) by mouth daily. If less expensive for better coverage may take 200 mg 2 of the chewable tablets for 10 days #20 07/11/15   Hassan RowanEugene Wade, MD   Meds Ordered and Administered this Visit  Medications - No data to display  BP 122/76 mmHg  Pulse 90  Temp(Src) 97 F (36.1 C) (Tympanic)  Resp 16  Ht 5\' 1"  (1.549 m)  Wt 185 lb (83.915 kg)  BMI 34.97 kg/m2  SpO2 98%  LMP 07/25/2015 (Approximate) No data found.   Physical Exam  Constitutional: She appears well-developed and well-nourished. No distress.  HENT:  Head: Normocephalic and atraumatic.  Right Ear: Tympanic membrane, external ear and ear canal normal.  Left Ear: Tympanic membrane,  external ear and ear canal normal.  Nose: No rhinorrhea, nose lacerations, sinus tenderness, nasal deformity, septal deviation or nasal septal hematoma. No epistaxis.  No foreign bodies.  Mouth/Throat: Uvula is midline, oropharynx is clear and moist and mucous membranes are normal. No oropharyngeal exudate.  Eyes: EOM are normal. Pupils are equal, round, and reactive to light. Right eye exhibits discharge. Left eye exhibits discharge. Right conjunctiva is injected. Left conjunctiva is injected. No scleral icterus.   Neck: Normal range of motion. Neck supple. No thyromegaly present.  Cardiovascular: Normal rate, regular rhythm and normal heart sounds.   Pulmonary/Chest: Effort normal and breath sounds normal. No respiratory distress. She has no wheezes. She has no rales.  Lymphadenopathy:    She has no cervical adenopathy.  Neurological: She is alert.  Skin: She is not diaphoretic.  Nursing note and vitals reviewed.   ED Course  Procedures (including critical care time)  Labs Review Labs Reviewed - No data to display  Imaging Review No results found.   Visual Acuity Review  Right Eye Distance: 20/100 ucorrected Left Eye Distance: 20/50 uncorrected Bilateral Distance:    Right Eye Near:   Left Eye Near:    Bilateral Near:         MDM   1. Bilateral conjunctivitis   2. Cough     Discharge Medication List as of 07/26/2015  1:46 PM    START taking these medications   Details  guaiFENesin-codeine 100-10 MG/5ML syrup 10 ml po qhs prn, Print    moxifloxacin (VIGAMOX) 0.5 % ophthalmic solution Place 1 drop into both eyes 3 (three) times daily., Starting 07/26/2015, Until Discontinued, Normal       1. diagnosis reviewed with patient 2. rx as per orders above; reviewed possible side effects, interactions, risks and benefits  3. Recommend supportive treatment with rest, increased fluids 4. Follow-up prn if symptoms worsen or don't improve    Payton Mccallum, MD 07/26/15 1357

## 2015-07-26 NOTE — ED Notes (Signed)
Patient c/o redness, watery drainage and vision changes in both eye that started yesterday.  Patient also c/o cough and chest congestion for a week.

## 2015-09-26 ENCOUNTER — Emergency Department
Admission: EM | Admit: 2015-09-26 | Discharge: 2015-09-27 | Disposition: A | Discharge status: Other Acute Inpatient Hospital | Payer: Medicaid Other | Attending: Emergency Medicine | Admitting: Emergency Medicine

## 2015-09-26 ENCOUNTER — Emergency Department: Payer: Medicaid Other

## 2015-09-26 DIAGNOSIS — O99331 Smoking (tobacco) complicating pregnancy, first trimester: Secondary | ICD-10-CM | POA: Insufficient documentation

## 2015-09-26 DIAGNOSIS — Z79899 Other long term (current) drug therapy: Secondary | ICD-10-CM | POA: Diagnosis not present

## 2015-09-26 DIAGNOSIS — Z3A09 9 weeks gestation of pregnancy: Secondary | ICD-10-CM | POA: Insufficient documentation

## 2015-09-26 DIAGNOSIS — O99511 Diseases of the respiratory system complicating pregnancy, first trimester: Secondary | ICD-10-CM | POA: Diagnosis not present

## 2015-09-26 DIAGNOSIS — F17210 Nicotine dependence, cigarettes, uncomplicated: Secondary | ICD-10-CM | POA: Diagnosis not present

## 2015-09-26 DIAGNOSIS — R079 Chest pain, unspecified: Secondary | ICD-10-CM

## 2015-09-26 DIAGNOSIS — O26891 Other specified pregnancy related conditions, first trimester: Secondary | ICD-10-CM | POA: Diagnosis present

## 2015-09-26 DIAGNOSIS — R0602 Shortness of breath: Secondary | ICD-10-CM | POA: Diagnosis not present

## 2015-09-26 DIAGNOSIS — R0600 Dyspnea, unspecified: Secondary | ICD-10-CM | POA: Insufficient documentation

## 2015-09-26 DIAGNOSIS — R0789 Other chest pain: Secondary | ICD-10-CM | POA: Diagnosis not present

## 2015-09-26 LAB — BASIC METABOLIC PANEL
ANION GAP: 11 (ref 5–15)
BUN: 11 mg/dL (ref 6–20)
CO2: 20 mmol/L — ABNORMAL LOW (ref 22–32)
Calcium: 9.1 mg/dL (ref 8.9–10.3)
Chloride: 104 mmol/L (ref 101–111)
Creatinine, Ser: 0.65 mg/dL (ref 0.44–1.00)
Glucose, Bld: 99 mg/dL (ref 65–99)
POTASSIUM: 3 mmol/L — AB (ref 3.5–5.1)
SODIUM: 135 mmol/L (ref 135–145)

## 2015-09-26 LAB — HEPATIC FUNCTION PANEL
ALK PHOS: 55 U/L (ref 38–126)
ALT: 37 U/L (ref 14–54)
AST: 30 U/L (ref 15–41)
Albumin: 4 g/dL (ref 3.5–5.0)
BILIRUBIN INDIRECT: 0.2 mg/dL — AB (ref 0.3–0.9)
BILIRUBIN TOTAL: 0.3 mg/dL (ref 0.3–1.2)
Bilirubin, Direct: 0.1 mg/dL (ref 0.1–0.5)
TOTAL PROTEIN: 7.2 g/dL (ref 6.5–8.1)

## 2015-09-26 LAB — TROPONIN I: TROPONIN I: 0.04 ng/mL — AB (ref <0.03)

## 2015-09-26 LAB — CBC
HCT: 43 % (ref 35.0–47.0)
HEMOGLOBIN: 14.6 g/dL (ref 12.0–16.0)
MCH: 29.4 pg (ref 26.0–34.0)
MCHC: 33.9 g/dL (ref 32.0–36.0)
MCV: 86.6 fL (ref 80.0–100.0)
Platelets: 206 10*3/uL (ref 150–440)
RBC: 4.97 MIL/uL (ref 3.80–5.20)
RDW: 13.2 % (ref 11.5–14.5)
WBC: 13.2 10*3/uL — AB (ref 3.6–11.0)

## 2015-09-26 LAB — HCG, QUANTITATIVE, PREGNANCY: hCG, Beta Chain, Quant, S: 39860 m[IU]/mL — ABNORMAL HIGH (ref <5)

## 2015-09-26 LAB — LIPASE, BLOOD: Lipase: 22 U/L (ref 11–51)

## 2015-09-26 MED ORDER — MORPHINE SULFATE (PF) 4 MG/ML IV SOLN
INTRAVENOUS | Status: AC
  Filled 2015-09-26: qty 1

## 2015-09-26 MED ORDER — ONDANSETRON HCL 4 MG/2ML IJ SOLN
INTRAMUSCULAR | Status: AC
  Filled 2015-09-26: qty 2

## 2015-09-26 MED ORDER — MORPHINE SULFATE (PF) 4 MG/ML IV SOLN
4.0000 mg | Freq: Once | INTRAVENOUS | Status: AC
  Administered 2015-09-26: 4 mg via INTRAVENOUS

## 2015-09-26 MED ORDER — ALUM & MAG HYDROXIDE-SIMETH 200-200-20 MG/5ML PO SUSP
30.0000 mL | Freq: Once | ORAL | Status: AC
  Administered 2015-09-26: 30 mL via ORAL
  Filled 2015-09-26: qty 30

## 2015-09-26 MED ORDER — IOPAMIDOL (ISOVUE-370) INJECTION 76%
75.0000 mL | Freq: Once | INTRAVENOUS | Status: AC | PRN
  Administered 2015-09-26: 75 mL via INTRAVENOUS
  Filled 2015-09-26: qty 75

## 2015-09-26 MED ORDER — HYDROMORPHONE HCL 1 MG/ML IJ SOLN
1.0000 mg | Freq: Once | INTRAMUSCULAR | Status: AC
  Administered 2015-09-26: 1 mg via INTRAVENOUS
  Filled 2015-09-26: qty 1

## 2015-09-26 MED ORDER — SODIUM CHLORIDE 0.9 % IV BOLUS (SEPSIS)
1000.0000 mL | Freq: Once | INTRAVENOUS | Status: AC
  Administered 2015-09-26: 1000 mL via INTRAVENOUS

## 2015-09-26 MED ORDER — ONDANSETRON HCL 4 MG/2ML IJ SOLN
4.0000 mg | Freq: Once | INTRAMUSCULAR | Status: AC
  Administered 2015-09-26: 4 mg via INTRAVENOUS

## 2015-09-26 NOTE — ED Notes (Signed)
Pt c/o of bilateral chest pain beginning at 9 am this morning with radiation to mid back described as tightness/throbbing, along with nausea, SOB, dizziness/lightheadedness and weakness.

## 2015-09-26 NOTE — ED Notes (Signed)
Pt reports decreased SOB with oxygen administration

## 2015-09-26 NOTE — ED Notes (Signed)
Pt's spouse would like to be called for any change in pt status: Laura [name] [phone]

## 2015-09-26 NOTE — ED Notes (Signed)
Pharmacy called regarding maalox, will send to ED momentarily.

## 2015-09-26 NOTE — ED Notes (Signed)
Pt reports SOB. Pt placed on 2L [place].

## 2015-09-26 NOTE — ED Notes (Signed)
MD at bedside. 

## 2015-09-26 NOTE — ED Provider Notes (Signed)
[facility] Emergency Department Provider Note   ____________________________________________  Time seen: Approximately 720 PM  I have reviewed the triage vital signs and the nursing notes.   HISTORY  Chief Complaint Chest Pain   HPI Laura [name] is a 25 y.o. female without any chronic medical conditions was presenting at [redacted] weeks pregnant with chest pain. Says that the chest pain started this morning and has been worsening. She says that it is radiating through to her back. She says that it is associated with shortness of breath as well as nausea. Denies any vomiting. Denies any vaginal bleeding or discharge. Denies any abdominal pain. Says that she has had a history of indigestion and says that she felt this may have been indigestion but that Tums did not resolve her pain. Denies any cough. Denies any fever. Denies any history of anxiety or panic attacks. Pain is moderate at this time and worsened with deep breathing.   No past medical history on file.  There are no active problems to display for this patient.   No past surgical history on file.  Current Outpatient Rx  Name  Route  Sig  Dispense  Refill  . chlorpheniramine-HYDROcodone (TUSSIONEX PENNKINETIC ER) 10-8 MG/5ML SUER   Oral   Take 5 mLs by mouth every 12 (twelve) hours as needed for cough.   115 mL   0   . fexofenadine-pseudoephedrine (ALLEGRA-D ALLERGY & CONGESTION) 180-240 MG 24 hr tablet   Oral   Take 1 tablet by mouth daily.   30 tablet   0   . guaiFENesin-codeine 100-10 MG/5ML syrup      10 ml po qhs prn   120 mL   0   . HYDROcodone-acetaminophen (NORCO) 5-325 MG tablet   Oral   Take 1 tablet by mouth every 8 (eight) hours as needed for moderate pain.   20 tablet   0   . ibuprofen (ADVIL,MOTRIN) 800 MG tablet   Oral   Take 800 mg by mouth every 8 (eight) hours as needed.         Marland Kitchen levonorgestrel (MIRENA) 20 MCG/24HR IUD   Intrauterine   1 each by Intrauterine  route once.         . meloxicam (MOBIC) 15 MG tablet   Oral   Take 1 tablet (15 mg total) by mouth daily.   30 tablet   1   . moxifloxacin (VIGAMOX) 0.5 % ophthalmic solution   Both Eyes   Place 1 drop into both eyes 3 (three) times daily.   3 mL   0   . ondansetron (ZOFRAN ODT) 8 MG disintegrating tablet   Oral   Take 1 tablet (8 mg total) by mouth every 8 (eight) hours as needed for nausea or vomiting.   20 tablet   0   . orphenadrine (NORFLEX) 100 MG tablet   Oral   Take 1 tablet (100 mg total) by mouth 2 (two) times daily.   60 tablet   0   . oxyCODONE-acetaminophen (ROXICET) 5-325 MG tablet   Oral   Take 1 tablet by mouth every 6 (six) hours as needed.   20 tablet   0   . SUPRAX 400 MG tablet   Oral   Take 1 tablet (400 mg total) by mouth daily. If less expensive for better coverage may take 200 mg 2 of the chewable tablets for 10 days #20   10 tablet   0     Dispense  as written.    Use the following for patient discount on Suprax B ...     Allergies Review of patient's allergies indicates no known allergies.  Family History  Problem Relation Age of Onset  . Cancer Paternal Grandmother   . Cancer Maternal Grandmother     Social History Social History  Substance Use Topics  . Smoking status: Current Every Day Smoker -- 0.50 packs/day    Types: Cigarettes  . Smokeless tobacco: Not on file  . Alcohol Use: Yes     Comment: socially    Review of Systems Constitutional: No fever/chills Eyes: No visual changes. ENT: No sore throat. Cardiovascular: As above Respiratory: As above Gastrointestinal: No abdominal pain.  No nausea, no vomiting.  No diarrhea.  No constipation. Genitourinary: Negative for dysuria. Musculoskeletal: As above Skin: Negative for rash. Neurological: Negative for headaches, focal weakness or numbness.  10-point ROS otherwise negative.  ____________________________________________   PHYSICAL EXAM:  VITAL SIGNS: ED  Triage Vitals  Enc Vitals Group     BP 09/26/15 1853 114/97 mmHg     Pulse Rate 09/26/15 1853 69     Resp 09/26/15 1853 20     Temp 09/26/15 1853 98.2 F (36.8 C)     Temp Source 09/26/15 1853 Oral     SpO2 09/26/15 1853 100 %     Weight 09/26/15 1853 190 lb (86.183 kg)     Height 09/26/15 1853 5\' 1"  (1.549 m)     Head Cir --      Peak Flow --      Pain Score 09/26/15 1853 10     Pain Loc --      Pain Edu? --      Excl. in GC? --     Constitutional: Alert and oriented. Well appearing and in no acute distress. Eyes: Conjunctivae are normal. PERRL. EOMI. Head: Atraumatic. Nose: No congestion/rhinnorhea. Mouth/Throat: Mucous membranes are moist.   Neck: No stridor.   Cardiovascular: Normal rate, regular rhythm. Grossly normal heart sounds.  Chest pain is not reproducible to palpation.  Respiratory: Normal respiratory effort But mildly tachypneic.  No retractions. Lungs CTAB. Gastrointestinal: Soft and nontender. No distention. No abdominal bruits. No CVA tenderness. Musculoskeletal: No lower extremity tenderness nor edema.  No joint effusions. Neurologic:  Normal speech and language. No gross focal neurologic deficits are appreciated.  Skin:  Skin is warm, dry and intact. No rash noted. Psychiatric: Mood and affect are normal. Speech and behavior are normal.  ____________________________________________   LABS (all labs ordered are listed, but only abnormal results are displayed)  Labs Reviewed  BASIC METABOLIC PANEL  CBC  TROPONIN I   ____________________________________________  EKG  ED ECG REPORT I, Schaevitz,  Teena Iraniavid M, the attending physician, personally viewed and interpreted this ECG.   Date: 09/26/2015  EKG Time: 1918  Rate: 56  Rhythm: sinus bradycardia  Axis: Normal axis  Intervals:none  ST&T Change: No ST segment elevation or depression. S1  q3 T3 pattern.  ED ECG REPORT I, Arelia LongestSchaevitz,  David M, the attending physician, personally viewed and  interpreted this ECG.   Date: 09/26/2015  EKG Time: 2047  Rate: 52  Rhythm: Sinus bradycardia  Axis: Normal axis  Intervals:none  ST&T Change: No ST segment elevation or depression. No abnormal T-wave inversion. Persistent S1 every 3 T3 pattern. Poor baseline secondary to the patient's deep respirations.    ____________________________________________  RADIOLOGY  Discussed chest x-ray with patient and she would like try medication all off on  further imaging as of now because of radiation exposure to the baby.      CT Angio Chest PE W/Cm &/Or Wo Cm (Final result) Result time: 09/26/15 21:38:02   Final result by Rad Results In Interface (09/26/15 21:38:02)   Narrative:   CLINICAL DATA: Bilateral chest pain beginning 12 hours ago. Radiation to the back. Shortness of breath. Weakness.  EXAM: CT ANGIOGRAPHY CHEST WITH CONTRAST  TECHNIQUE: Multidetector CT imaging of the chest was performed using the standard protocol during bolus administration of intravenous contrast. Multiplanar CT image reconstructions and MIPs were obtained to evaluate the vascular anatomy.  CONTRAST: 75 cc Isovue 370  COMPARISON: None.  FINDINGS: Pulmonary arterial opacification is excellent. There are no pulmonary emboli.  The aorta appears normal. No coronary artery calcification. Lungs are clear. No pleural or could pericardial fluid. Scans in the upper abdomen are normal. No bone abnormality.  Review of the MIP images confirms the above findings.  IMPRESSION: Normal examination.   Electronically Signed By: Paulina Fusi M.D. On: 09/26/2015 21:38       ___________ _________________________________   PROCEDURES    Procedures CRITICAL CARE Performed by: Arelia Longest   Total critical care time: 35 minutes  Critical care time was exclusive of separately billable procedures and treating other patients.  Critical care was necessary to treat or prevent  imminent or life-threatening deterioration.  Critical care was time spent personally by me on the following activities: development of treatment plan with patient and/or surrogate as well as nursing, discussions with consultants, evaluation of patient's response to treatment, examination of patient, obtaining history from patient or surrogate, ordering and performing treatments and interventions, ordering and review of laboratory studies, ordering and review of radiographic studies, pulse oximetry and re-evaluation of patient's condition.   ____________________________________________   INITIAL IMPRESSION / ASSESSMENT AND PLAN / ED COURSE  Pertinent labs & imaging results that were available during my care of the patient were reviewed by me and considered in my medical decision making (see chart for details).  Patient said that she'll be following up the [facility] side OB/GYN clinic for further care.  ----------------------------------------- 8:59 PM on 09/26/2015 -----------------------------------------  Patient laboratory results from with an elevated troponin. I discussed the case Dr. Allena Katz of radiology who agrees with a CT angiography to rule out pulmonary embolus. I also took the patient's bilateral radial as well as dorsalis pedis pulses and they're present and equal bilaterally.  I explained the risks and benefits of the CT angiography to the patient, who is pregnant at [redacted] weeks, who understands and accepts the risks and is willing to proceed with the CAT scan. Patient now with 10 out of 10 pain. Will dose morphine as well as Zofran.  ----------------------------------------- 10:17 PM on 09/26/2015 -----------------------------------------  At this point I'm awaiting callback from the [facility] transfer center to speak with her cardiologist. I have already spoken to Dr. Welton Flakes of cardiology here at [facility]. We reviewed the CT imaging and he says that the patient will need to be transferred in  case she needs CT surgery which he did not have at [facility]. The patient was found to not have a pulmonary embolus and her scan was read as normal. Therefore, it is very concerning that she could have a coronary artery dissection. She is requesting a second dose of morphine because her pain is still not 10 after the first dose. She is aware of the need to transfer to [facility]. Originally, I called out to [employer]  that they're cardiac team was occupied already in a STEMI and there would have been a delay. The patient as well as the patient's family have been updated as to the CAT scan results as well as the need for transfer understanding and willing to comply.  ----------------------------------------- 11:00 PM on 09/26/2015 -----------------------------------------  Received formal acceptance from the [facility] transfer center. I had previously discussed to the cardiology fellow, Dr. Bobette MoWegermann.  However, Dr. Lawernce PittsWagermann wanted to discuss the case with the OB/GYN service to see if they wanted to take the patient primarily.  In the end, the patient was accepted as an ER to ER transfer and will be evaluated by both the cardiology as well as OB[facility]GYN service in [facility] department [facility]. The patient was updated and is aware of the transfer process. Will be transferred by [facility]. ____________________________________________   FINAL CLINICAL IMPRESSION(S) / ED DIAGNOSES  Chest pain. Shortness of breath.    NEW MEDICATIONS STARTED DURING THIS VISIT:  New Prescriptions   No medications on file     Note:  This document was prepared using Dragon voice recognition software and may include unintentional dictation errors.    Myrna Blazeravid Matthew Schaevitz, MD 09/26/15 [phone]

## 2015-09-27 MED ORDER — ONDANSETRON HCL 4 MG/2ML IJ SOLN
INTRAMUSCULAR | Status: AC
  Filled 2015-09-27: qty 2

## 2015-09-27 MED ORDER — ONDANSETRON HCL 4 MG/2ML IJ SOLN
4.0000 mg | Freq: Once | INTRAMUSCULAR | Status: AC
  Administered 2015-09-27: 4 mg via INTRAVENOUS

## 2015-09-27 MED ORDER — HYDROMORPHONE HCL 1 MG/ML IJ SOLN
1.0000 mg | Freq: Once | INTRAMUSCULAR | Status: AC
  Administered 2015-09-27: 1 mg via INTRAVENOUS

## 2015-09-27 MED ORDER — HYDROMORPHONE HCL 1 MG/ML IJ SOLN
INTRAMUSCULAR | Status: AC
  Filled 2015-09-27: qty 1

## 2015-10-09 LAB — OB RESULTS CONSOLE HEPATITIS B SURFACE ANTIGEN: Hepatitis B Surface Ag: NEGATIVE

## 2015-10-09 LAB — OB RESULTS CONSOLE RUBELLA ANTIBODY, IGM: Rubella: IMMUNE

## 2015-10-09 LAB — OB RESULTS CONSOLE VARICELLA ZOSTER ANTIBODY, IGG: Varicella: UNDETERMINED

## 2015-10-09 LAB — OB RESULTS CONSOLE RPR: RPR: NONREACTIVE

## 2015-10-09 LAB — OB RESULTS CONSOLE HIV ANTIBODY (ROUTINE TESTING): HIV: NONREACTIVE

## 2016-02-04 ENCOUNTER — Emergency Department
Admission: EM | Admit: 2016-02-04 | Discharge: 2016-02-04 | Disposition: A | Discharge status: Home / Self Care | Payer: Medicaid Other | Attending: Emergency Medicine | Admitting: Emergency Medicine

## 2016-02-04 ENCOUNTER — Emergency Department: Payer: Medicaid Other

## 2016-02-04 DIAGNOSIS — I82812 Embolism and thrombosis of superficial veins of left lower extremities: Secondary | ICD-10-CM | POA: Diagnosis not present

## 2016-02-04 DIAGNOSIS — Z3A25 25 weeks gestation of pregnancy: Secondary | ICD-10-CM | POA: Diagnosis not present

## 2016-02-04 DIAGNOSIS — F17210 Nicotine dependence, cigarettes, uncomplicated: Secondary | ICD-10-CM | POA: Insufficient documentation

## 2016-02-04 DIAGNOSIS — O99412 Diseases of the circulatory system complicating pregnancy, second trimester: Secondary | ICD-10-CM | POA: Insufficient documentation

## 2016-02-04 DIAGNOSIS — Z79899 Other long term (current) drug therapy: Secondary | ICD-10-CM | POA: Diagnosis not present

## 2016-02-04 DIAGNOSIS — O99332 Smoking (tobacco) complicating pregnancy, second trimester: Secondary | ICD-10-CM | POA: Insufficient documentation

## 2016-02-04 MED ORDER — MEDICAL COMPRESSION STOCKINGS MISC: 2.0000 [IU] | 0 refills | Status: DC | PRN

## 2016-02-04 NOTE — ED Provider Notes (Signed)
[facility] Emergency Department Provider Note  ____________________________________________  Time seen: Approximately 3:34 PM  I have reviewed the triage vital signs and the nursing notes.   HISTORY  Chief Complaint Leg Pain     HPI Laura [name] is a 25 y.o. female who presents emergency department complaining of pain and a "knot" to the left posterior knee. Patient states that symptoms and lesion has been present for several weeks. Patient states that pain is typically only at nighttime when she lays down. She denies any lower extremity edema accompanying this. She denies any chest pain or shortness of breath. Patient states the area is tender to palpation. She states over the last several days area has become more painful and is now causing pain during the day which is abnormal. She still denies any other symptoms or complaints. Patient is pregnant at [redacted] weeks but denies any other medical history. Patient does have an OB/GYN and is being followed by same. No other complaints at this time. No medications prior to arrival. No history of DVT. Patient is pregnant, does smoke, but no recent surgeries or travels.   History reviewed. No pertinent past medical history.  There are no active problems to display for this patient.   History reviewed. No pertinent surgical history.  Prior to Admission medications   Medication Sig Start Date End Date Taking? Authorizing Provider  chlorpheniramine-HYDROcodone (TUSSIONEX PENNKINETIC ER) 10-8 MG/5ML SUER Take 5 mLs by mouth every 12 (twelve) hours as needed for cough. 07/11/15   Hassan Rowan, MD  Elastic Bandages & Supports (MEDICAL COMPRESSION STOCKINGS) MISC 2 Units by Does not apply route as needed. 02/04/16   Delorise Royals Cuthriell, PA-C  fexofenadine-pseudoephedrine (ALLEGRA-D ALLERGY & CONGESTION) 180-240 MG 24 hr tablet Take 1 tablet by mouth daily. 07/11/15   Hassan Rowan, MD  guaiFENesin-codeine 100-10 MG/5ML syrup 10  ml po qhs prn 07/26/15   Payton Mccallum, MD  HYDROcodone-acetaminophen (NORCO) 5-325 MG tablet Take 1 tablet by mouth every 8 (eight) hours as needed for moderate pain. 04/30/15   Hassan Rowan, MD  ibuprofen (ADVIL,MOTRIN) 800 MG tablet Take 800 mg by mouth every 8 (eight) hours as needed.    Historical Provider, MD  levonorgestrel (MIRENA) 20 MCG/24HR IUD 1 each by Intrauterine route once.    Historical Provider, MD  meloxicam (MOBIC) 15 MG tablet Take 1 tablet (15 mg total) by mouth daily. 04/30/15   Hassan Rowan, MD  moxifloxacin (VIGAMOX) 0.5 % ophthalmic solution Place 1 drop into both eyes 3 (three) times daily. 07/26/15   Payton Mccallum, MD  ondansetron (ZOFRAN ODT) 8 MG disintegrating tablet Take 1 tablet (8 mg total) by mouth every 8 (eight) hours as needed for nausea or vomiting. 04/30/15   Hassan Rowan, MD  orphenadrine (NORFLEX) 100 MG tablet Take 1 tablet (100 mg total) by mouth 2 (two) times daily. 04/30/15   Hassan Rowan, MD  oxyCODONE-acetaminophen (ROXICET) 5-325 MG tablet Take 1 tablet by mouth every 6 (six) hours as needed. 05/05/15   Minna Antis, MD  SUPRAX 400 MG tablet Take 1 tablet (400 mg total) by mouth daily. If less expensive for better coverage may take 200 mg 2 of the chewable tablets for 10 days #20 07/11/15   Hassan Rowan, MD    Allergies Patient has no known allergies.  Family History  Problem Relation Age of Onset  . Cancer Paternal Grandmother   . Cancer Maternal Grandmother     Social History Social History  Substance Use Topics  .  Smoking status: Current Every Day Smoker    Packs/day: 0.50    Types: Cigarettes  . Smokeless tobacco: Never Used  . Alcohol use Yes     Comment: socially     Review of Systems  Constitutional: No fever/chills Cardiovascular: no chest pain. Respiratory: no cough. No SOB. Gastrointestinal: No abdominal pain.  No nausea, no vomiting.  No diarrhea.  No constipation. Genitourinary: Negative for dysuria. No hematuria. No vaginal  bleeding or discharge. Musculoskeletal: Positive for pain to the posterior left knee. Skin: Negative for rash, abrasions, lacerations, ecchymosis. Neurological: Negative for headaches, focal weakness or numbness. 10-point ROS otherwise negative.  ____________________________________________   PHYSICAL EXAM:  VITAL SIGNS: ED Triage Vitals [02/04/16 1256]  Enc Vitals Group     BP 117/72     Pulse Rate 91     Resp 16     Temp 97.3 F (36.3 C)     Temp Source Oral     SpO2 100 %     Weight 205 lb (93 kg)     Height 5\' 1"  (1.549 m)     Head Circumference      Peak Flow      Pain Score 8     Pain Loc      Pain Edu?      Excl. in GC?      Constitutional: Alert and oriented. Well appearing and in no acute distress. Eyes: Conjunctivae are normal. PERRL. EOMI. Head: Atraumatic. Neck: No stridor.    Cardiovascular: Normal rate, regular rhythm. Normal S1 and S2.  Good peripheral circulation. Respiratory: Normal respiratory effort without tachypnea or retractions. Lungs CTAB. Good air entry to the bases with no decreased or absent breath sounds. Gastrointestinal: Bowel sounds 4 quadrants. Soft and nontender to palpation. No guarding or rigidity. No palpable masses. No distention. No CVA tenderness. Musculoskeletal: Full range of motion to all extremities. No gross deformities appreciated. Poor range of motion to left knee. No gross edema or erythema. Patient does have a erythematous lesion to the stereo medial aspect of the knee. Area appears to be possibly varicocele in nature. Dorsalis pedis pulse intact distally. Sensation intact 5 digits distally. Neurologic:  Normal speech and language. No gross focal neurologic deficits are appreciated.  Skin:  Skin is warm, dry and intact. No rash noted. Psychiatric: Mood and affect are normal. Speech and behavior are normal. Patient exhibits appropriate insight and judgement.   ____________________________________________   LABS (all labs  ordered are listed, but only abnormal results are displayed)  Labs Reviewed - No data to display ____________________________________________  EKG   ____________________________________________  RADIOLOGY Festus BarrenI, Jonathan D Cuthriell, personally viewed and evaluated these images (plain radiographs) as part of my medical decision making, as well as reviewing the written report by the radiologist.  [place] Venous Img Lower Unilateral Left  Result Date: 02/04/2016 CLINICAL DATA:  Left lower extremity pain and swelling for 2 days. EXAM: LEFT LOWER EXTREMITY VENOUS DOPPLER ULTRASOUND TECHNIQUE: Gray-scale sonography with graded compression, as well as color Doppler and duplex ultrasound, were performed to evaluate the deep venous system from the level of the common femoral vein through the popliteal and proximal calf veins. Spectral Doppler was utilized to evaluate flow at rest and with distal augmentation maneuvers. COMPARISON:  None. FINDINGS: Right common femoral vein is patent without thrombus. Normal compressibility, augmentation and color Doppler flow in the left common femoral vein, left femoral vein and left popliteal vein. The left saphenofemoral junction is patent. Left profunda femoral vein  is patent without thrombus. Visualized left deep calf veins are patent without thrombus. The left great saphenous vein in the mid thigh is normal and compressible. Abnormal appearance of the left great saphenous vein at the medial knee. There is a focal thrombosed superficial venous structure that roughly measures up to 1.7 cm in this area. This thrombosis appears to be just anterior to the great saphenous vein and could represent a varicosity or part of the great saphenous vein. IMPRESSION: Negative for deep venous thrombosis in left lower extremity. There is superficial venous thrombosis at the area of concern along the medial knee. There is a focal thrombus in this area which could represent a short segment of the  left GSV versus a varicosity. Electronically Signed   By: Richarda OverlieAdam  Henn M.D.   On: 02/04/2016 14:19    ____________________________________________    PROCEDURES  Procedure(s) performed:    Procedures    Medications - No data to display   ____________________________________________   INITIAL IMPRESSION / ASSESSMENT AND PLAN / ED COURSE  Pertinent labs & imaging results that were available during my care of the patient were reviewed by me and considered in my medical decision making (see chart for details).  Review of the [place] CSRS was performed in accordance of the [facility] prior to dispensing any controlled drugs.  Clinical Course     Patient's diagnosis is consistent with Superficial venous thrombosis of the left lower extremity. Patient is also [redacted] weeks pregnant. Ultrasound reveals possible superficial venous thrombosis versus varicocele to left lower extremity. Patient does not have any concerning symptoms at this time. At this time, there is no indication for anticoagulation. Patient is given instructions to use warm and cold compresses, keep extremity elevated, use compression stockings. Patient is able to have close follow-up with primary care and OB/GYN and is advised to follow-up in 7-10 days for repeat evaluation..  Patient is given ED precautions to return to the ED for any worsening or new symptoms.     ____________________________________________  FINAL CLINICAL IMPRESSION(S) / ED DIAGNOSES  Final diagnoses:  Superficial venous thrombosis of left upper extremity  [redacted] weeks gestation of pregnancy      NEW MEDICATIONS STARTED DURING THIS VISIT:  Discharge Medication List as of 02/04/2016  3:57 PM    START taking these medications   Details  Elastic Bandages & Supports (MEDICAL COMPRESSION STOCKINGS) MISC 2 Units by Does not apply route as needed., Starting Tue 02/04/2016, Print            This chart was dictated using voice recognition software/Dragon.  Despite best efforts to proofread, errors can occur which can change the meaning. Any change was purely unintentional.    Racheal PatchesJonathan D Cuthriell, PA-C 02/04/16 1639    Minna AntisKevin Paduchowski, MD 02/04/16 2250

## 2016-02-04 NOTE — ED Triage Notes (Signed)
Pt states she is [redacted] weeks pregnant and has had a knot on the back of the left knee for several weeks, states in the past 2 days she has had increased swelling of the area with pain..Marland Kitchen

## 2016-03-23 NOTE — L&D Delivery Note (Addendum)
Estimated Date of Delivery: 05/20/16 EGA: [id number]  Delivery Note At 9:08 AM a viable female was delivered via Vaginal, Spontaneous Delivery (Presentation: LOA).  APGAR: 8, 9; Weight:  3912g.   Placenta status: spontaneous, intact.  Cord:  with the following complications: none.  Cord pH: NA  Called to see patient.  Mom pushed to deliver a viable female infant.  The head followed by shoulders, which delivered without difficulty, and the rest of the body.  Nuchal cord noted and easily reduced.  Baby to mom's chest.  Cord clamped and cut after 3 min delay.  No cord blood obtained.  Placenta delivered spontaneously, intact, with a 3-vessel cord.  Perineum intact, no lacerations.  All counts correct.  Hemostasis obtained with IV pitocin and fundal massage.  Anesthesia: epidural  Episiotomy: None Lacerations: None Suture Repair: NA Est. Blood Loss (mL): 350  Mom to postpartum.  Baby Boy Richardson DoppCole to Couplet care / Skin to Skin.  Ketan Renz, CNM 05/18/2016, 9:34 AM

## 2016-03-24 ENCOUNTER — Observation Stay
Admission: EM | Admit: 2016-03-24 | Discharge: 2016-03-24 | Disposition: A | Discharge status: Home / Self Care | Payer: Medicaid Other | Attending: Obstetrics & Gynecology | Admitting: Obstetrics & Gynecology

## 2016-03-24 DIAGNOSIS — Z3A31 31 weeks gestation of pregnancy: Secondary | ICD-10-CM | POA: Diagnosis not present

## 2016-03-24 DIAGNOSIS — R112 Nausea with vomiting, unspecified: Secondary | ICD-10-CM | POA: Insufficient documentation

## 2016-03-24 DIAGNOSIS — O99333 Smoking (tobacco) complicating pregnancy, third trimester: Secondary | ICD-10-CM | POA: Insufficient documentation

## 2016-03-24 DIAGNOSIS — F17210 Nicotine dependence, cigarettes, uncomplicated: Secondary | ICD-10-CM | POA: Diagnosis not present

## 2016-03-24 DIAGNOSIS — O26893 Other specified pregnancy related conditions, third trimester: Secondary | ICD-10-CM | POA: Diagnosis not present

## 2016-03-24 DIAGNOSIS — O368130 Decreased fetal movements, third trimester, not applicable or unspecified: Secondary | ICD-10-CM | POA: Diagnosis present

## 2016-03-24 LAB — URINALYSIS, COMPLETE (UACMP) WITH MICROSCOPIC
BACTERIA UA: NONE SEEN
BILIRUBIN URINE: NEGATIVE
Glucose, UA: NEGATIVE mg/dL
Hgb urine dipstick: NEGATIVE
KETONES UR: 80 mg/dL — AB
LEUKOCYTES UA: NEGATIVE
Nitrite: NEGATIVE
PROTEIN: 30 mg/dL — AB
Specific Gravity, Urine: 1.023 (ref 1.005–1.030)
pH: 5 (ref 5.0–8.0)

## 2016-03-24 MED ORDER — ONDANSETRON 4 MG PO TBDP: 4.0000 mg | ORAL_TABLET | Freq: Three times a day (TID) | ORAL | 0 refills | Status: DC | PRN

## 2016-03-24 MED ORDER — ONDANSETRON 4 MG PO TBDP
4.0000 mg | ORAL_TABLET | Freq: Three times a day (TID) | ORAL | Status: DC | PRN
  Administered 2016-03-24: 4 mg via ORAL
  Filled 2016-03-24: qty 1

## 2016-03-24 NOTE — Discharge Summary (Signed)
Physician Final Progress Note  Patient ID: Laura [name] MRN: [id number] DOB/AGE: October 17, 1990 26 y.o.  Admit date: 03/24/2016 Admitting provider: Tresea Mall, CNM Discharge date: 03/24/2016   Admission Diagnoses: G2P1 at [redacted]w[redacted]d with c/o nausea and vomiting and decreased fetal movement since this morning. Pt states she also has chills. Pt denies LOF, VB  Discharge Diagnoses:  Active Problems:   Indication for care in labor and delivery, antepartum IUP with reactive NST accelerations 10x10, some uterine irritability, decreased hydration    History reviewed. No pertinent past medical history.  History reviewed. No pertinent surgical history.  No current facility-administered medications on file prior to encounter.    Current Outpatient Prescriptions on File Prior to Encounter  Medication Sig Dispense Refill  . chlorpheniramine-HYDROcodone (TUSSIONEX PENNKINETIC ER) 10-8 MG/5ML SUER Take 5 mLs by mouth every 12 (twelve) hours as needed for cough. (Patient not taking: Reported on 03/24/2016) 115 mL 0  . Elastic Bandages & Supports (MEDICAL COMPRESSION STOCKINGS) MISC 2 Units by Does not apply route as needed. (Patient not taking: Reported on 03/24/2016) 2 each 0  . fexofenadine-pseudoephedrine (ALLEGRA-D ALLERGY & CONGESTION) 180-240 MG 24 hr tablet Take 1 tablet by mouth daily. (Patient not taking: Reported on 03/24/2016) 30 tablet 0  . guaiFENesin-codeine 100-10 MG/5ML syrup 10 ml po qhs prn (Patient not taking: Reported on 03/24/2016) 120 mL 0  . HYDROcodone-acetaminophen (NORCO) 5-325 MG tablet Take 1 tablet by mouth every 8 (eight) hours as needed for moderate pain. (Patient not taking: Reported on 03/24/2016) 20 tablet 0  . ibuprofen (ADVIL,MOTRIN) 800 MG tablet Take 800 mg by mouth every 8 (eight) hours as needed.    Marland Kitchen levonorgestrel (MIRENA) 20 MCG/24HR IUD 1 each by Intrauterine route once.    . meloxicam (MOBIC) 15 MG tablet Take 1 tablet (15 mg total) by mouth daily. (Patient not  taking: Reported on 03/24/2016) 30 tablet 1  . moxifloxacin (VIGAMOX) 0.5 % ophthalmic solution Place 1 drop into both eyes 3 (three) times daily. (Patient not taking: Reported on 03/24/2016) 3 mL 0  . ondansetron (ZOFRAN ODT) 8 MG disintegrating tablet Take 1 tablet (8 mg total) by mouth every 8 (eight) hours as needed for nausea or vomiting. (Patient not taking: Reported on 03/24/2016) 20 tablet 0  . orphenadrine (NORFLEX) 100 MG tablet Take 1 tablet (100 mg total) by mouth 2 (two) times daily. (Patient not taking: Reported on 03/24/2016) 60 tablet 0  . oxyCODONE-acetaminophen (ROXICET) 5-325 MG tablet Take 1 tablet by mouth every 6 (six) hours as needed. (Patient not taking: Reported on 03/24/2016) 20 tablet 0  . SUPRAX 400 MG tablet Take 1 tablet (400 mg total) by mouth daily. If less expensive for better coverage may take 200 mg 2 of the chewable tablets for 10 days #20 (Patient not taking: Reported on 03/24/2016) 10 tablet 0    No Known Allergies  Social History   Social History  . Marital status: Single    Spouse name: N/A  . Number of children: N/A  . Years of education: N/A   Occupational History  . Not on file.   Social History Main Topics  . Smoking status: Current Every Day Smoker    Packs/day: 0.50    Types: Cigarettes  . Smokeless tobacco: Former Neurosurgeon    Quit date: 04/21/2012  . Alcohol use No  . Drug use: No  . Sexual activity: Yes   Other Topics Concern  . Not on file   Social History Narrative  . No narrative  on file    Physical Exam: BP 113/76 (BP Location: Left Arm)   Pulse (!) 149   Temp 98.2 F (36.8 C) (Oral)   Resp 18   Ht 5\' 1"  (1.549 m)   Wt 205 lb (93 kg)   LMP 08/14/2015 Comment: [redacted] weeks pregnant  BMI 38.73 kg/m   Gen: NAD CV: RRR Pulm: CTAB Pelvic: deferred Ext: no evidence of DVT Toco: q 2-6 minute mild braxton hicks Fetal Well Being: 145 bpm, moderate variability, +accelerations, -decelerations  Consults: None  Significant Findings/  Diagnostic Studies: labs:   Results for SCOTT, FIX (MRN [id number]) as of 03/24/2016 15:21  Ref. Range 03/24/2016 14:12  Appearance Latest Ref Range: CLEAR  HAZY (A)  Bacteria, UA Latest Ref Range: NONE SEEN  NONE SEEN  Bilirubin Urine Latest Ref Range: NEGATIVE  NEGATIVE  Color, Urine Latest Ref Range: YELLOW  YELLOW (A)  Glucose Latest Ref Range: NEGATIVE mg/dL NEGATIVE  Hgb urine dipstick Latest Ref Range: NEGATIVE  NEGATIVE  Ketones, ur Latest Ref Range: NEGATIVE mg/dL 80 (A)  Leukocytes, UA Latest Ref Range: NEGATIVE  NEGATIVE  Mucous Unknown PRESENT  Nitrite Latest Ref Range: NEGATIVE  NEGATIVE  pH Latest Ref Range: 5.0 - 8.0  5.0  Protein Latest Ref Range: NEGATIVE mg/dL 30 (A)  RBC / HPF Latest Ref Range: 0 - 5 RBC/hpf 0-5  Specific Gravity, Urine Latest Ref Range: 1.005 - 1.030  1.023  Squamous Epithelial / LPF Latest Ref Range: NONE SEEN  6-30 (A)  WBC, UA Latest Ref Range: 0 - 5 WBC/hpf 0-5    Procedures: NST  Discharge Condition: good  Disposition: 01-Home or Self Care  Diet: Regular diet, increase hydration  Discharge Activity: Activity as tolerated, rest today  Discharge Instructions    Discharge activity:  No Restrictions    Complete by:  As directed    Discharge diet:  No restrictions    Complete by:  As directed    No sexual activity restrictions    Complete by:  As directed    Notify physician for a general feeling that "something is not right"    Complete by:  As directed    Notify physician for increase or change in vaginal discharge    Complete by:  As directed    Notify physician for intestinal cramps, with or without diarrhea, sometimes described as "gas pain"    Complete by:  As directed    Notify physician for leaking of fluid    Complete by:  As directed    Notify physician for low, dull backache, unrelieved by heat or Tylenol    Complete by:  As directed    Notify physician for menstrual like cramps    Complete by:  As directed    Notify  physician for pelvic pressure    Complete by:  As directed    Notify physician for uterine contractions.  These may be painless and feel like the uterus is tightening or the baby is  "balling up"    Complete by:  As directed    Notify physician for vaginal bleeding    Complete by:  As directed    PRETERM LABOR:  Includes any of the follwing symptoms that occur between 20 - [redacted] weeks gestation.  If these symptoms are not stopped, preterm labor can result in preterm delivery, placing your baby at risk    Complete by:  As directed      Allergies as of 03/24/2016   No Known  Allergies     Medication List    STOP taking these medications   chlorpheniramine-HYDROcodone 10-8 MG/5ML Suer Commonly known as:  TUSSIONEX PENNKINETIC ER   fexofenadine-pseudoephedrine 180-240 MG 24 hr tablet Commonly known as:  ALLEGRA-D ALLERGY & CONGESTION   guaiFENesin-codeine 100-10 MG/5ML syrup   HYDROcodone-acetaminophen 5-325 MG tablet Commonly known as:  NORCO   ibuprofen 800 MG tablet Commonly known as:  ADVIL,MOTRIN   levonorgestrel 20 MCG/24HR IUD Commonly known as:  MIRENA   Medical Compression Stockings Misc   meloxicam 15 MG tablet Commonly known as:  MOBIC   moxifloxacin 0.5 % ophthalmic solution Commonly known as:  VIGAMOX   orphenadrine 100 MG tablet Commonly known as:  NORFLEX   oxyCODONE-acetaminophen 5-325 MG tablet Commonly known as:  ROXICET   SUPRAX 400 MG tablet Generic drug:  cefixime     TAKE these medications   ondansetron 4 MG disintegrating tablet Commonly known as:  ZOFRAN-ODT Take 1 tablet (4 mg total) by mouth every 8 (eight) hours as needed for nausea or vomiting. What changed:  medication strength  how much to take      Follow-up Information    [facility].   Why:  go to regular scheduled prenatal appointment Contact information: [address] [place] [address] [phone]           Total time spent  taking care of this patient: 25 minutes  Signed: Obie DredgeGLEDHILL,Farrah Skoda, CNM  03/24/2016, 3:23 PM

## 2016-03-24 NOTE — Discharge Instructions (Signed)
Got to your scheduled follow up appointment on 03/25/2016. Drink plenty of fluids and rest. Call your provider about any other questions or concerns.

## 2016-03-31 ENCOUNTER — Encounter: Payer: Medicaid Other | Attending: Obstetrics and Gynecology | Admitting: Dietician

## 2016-03-31 ENCOUNTER — Encounter: Payer: Self-pay | Admitting: Dietician

## 2016-03-31 VITALS — BP 106/72 | Ht 61.0 in | Wt 211.4 lb

## 2016-03-31 DIAGNOSIS — Z713 Dietary counseling and surveillance: Secondary | ICD-10-CM | POA: Insufficient documentation

## 2016-03-31 DIAGNOSIS — O24419 Gestational diabetes mellitus in pregnancy, unspecified control: Secondary | ICD-10-CM | POA: Diagnosis present

## 2016-03-31 DIAGNOSIS — O24410 Gestational diabetes mellitus in pregnancy, diet controlled: Secondary | ICD-10-CM

## 2016-03-31 NOTE — Patient Instructions (Signed)
Read booklet on Gestational Diabetes Follow Gestational Meal Planning Guidelines Complete a 3 Day Food Record and bring to next appointment Check blood sugars 4 x day - before breakfast and 2 hrs after every meal and record  Bring blood sugar log to all appointments Call MD for prescription for meter strips and lancets Strips   Accuchek Guide test strips  Lancets   Accuchek Fast Clix lancets Walk 20-30 minutes at least 5 x week if permitted by MD Next appointment    04-08-16

## 2016-03-31 NOTE — Progress Notes (Signed)
Diabetes Self-Management Education  Visit Type: First/Initial  Appt. Start Time:1330 Appt. End Time:1500  03/31/2016  [name], identified by name and date of birth, is a 26 y.o. female with a diagnosis of Diabetes: Gestational Diabetes.   ASSESSMENT  Blood pressure 106/72, height 5\' 1"  (1.549 m), weight 211 lb 6.4 oz (95.9 kg), last menstrual period 08/14/2015. Body mass index is 39.94 kg/m.      Diabetes Self-Management Education - 03/31/16 1534      Visit Information   Visit Type First/Initial     Initial Visit   Diabetes Type Gestational Diabetes     Health Coping   How would you rate your overall health? Good     Psychosocial Assessment   Patient Belief/Attitude about Diabetes Motivated to manage diabetes   Self-care barriers None   Patient Concerns Glycemic Control   Special Needs None   Preferred Learning Style Visual   Learning Readiness Ready   What is the last grade level you completed in school? 12+ some college     Pre-Education Assessment   Patient understands the diabetes disease and treatment process. Needs Instruction   Patient understands incorporating nutritional management into lifestyle. Needs Instruction   Patient undertands incorporating physical activity into lifestyle. Needs Instruction   Patient understands using medications safely. Needs Instruction   Patient understands monitoring blood glucose, interpreting and using results Needs Instruction   Patient understands prevention, detection, and treatment of acute complications. Needs Instruction   Patient understands prevention, detection, and treatment of chronic complications. Needs Instruction   Patient understands how to develop strategies to address psychosocial issues. Needs Instruction   Patient understands how to develop strategies to promote health/change behavior. Needs Instruction     Complications   How often do you check your blood sugar? 0 times/day (not testing)   Have  you had a dilated eye exam in the past 12 months? No  2 yr ago   Have you had a dental exam in the past 12 months? No  5 yr ago   Are you checking your feet? No     Dietary Intake   Breakfast --  eats breakfast at 9a   Snack (morning) --  eats fruit or crackers at 10-10:30A   Lunch --  eats lunch at 12p; eats fried foods 2-3x/wk   Snack (afternoon) --  eats fruit or crackers at 3p   Dinner --  eats supper at 7p   Snack (evening) --  eats occasional 9p snack=popcorn or chips   Beverage(s) --  drinks water 4-5x/day and gatorade 4x/day; milk 1x/day     Exercise   Exercise Type ADL's     Patient Education   Previous Diabetes Education No   Disease state  Explored patient's options for treatment of their diabetes  discussed GDM and importance of good BG control to lower risk of complications   Nutrition management  Role of diet in the treatment of diabetes and the relationship between the three main macronutrients and blood glucose level;Food label reading, portion sizes and measuring food.;Carbohydrate counting   Physical activity and exercise  Helped patient identify appropriate exercises in relation to his/her diabetes, diabetes complications and other health issue.   Medications --  discussed options for medications if needed for treatment of GDM   Monitoring Taught/evaluated SMBG meter.;Purpose and frequency of SMBG.;Taught/discussed recording of test results and interpretation of SMBG.  gave pt Accuchek Guide meter and instructed on its use-BG 73ac lunch   Psychosocial adjustment Role  of stress on diabetes   Preconception care Pregnancy and GDM  Role of pre-pregnancy blood glucose control on the development of the fetus;Reviewed with patient blood glucose goals with pregnancy;Role of family planning for patients with diabetes   Personal strategies to promote health Lifestyle issues that need to be addressed for better diabetes care;Helped patient develop diabetes management plan  for (enter comment)      Individualized Plan for Diabetes Self-Management Training:   Learning Objective:  Patient will have a greater understanding of diabetes self-management. Patient education plan is to attend individual and/or group sessions per assessed needs and concerns.   Plan:   Patient Instructions  Read booklet on Gestational Diabetes Follow Gestational Meal Planning Guidelines Complete a 3 Day Food Record and bring to next appointment Check blood sugars 4 x day - before breakfast and 2 hrs after every meal and record  Bring blood sugar log to all appointments Call MD for prescription for meter strips and lancets Strips   Accuchek Guide test strips  Lancets   Accuchek Fast Clix lancets Walk 20-30 minutes at least 5 x week if permitted by MD Next appointment    04-08-16   Expected Outcomes:   positive  Education material provided: General meal planning Guidelines, Accuchek Guide meter, GDM booklet, GDM video  If problems or questions, patient to contact team via:  [phone]  Future DSME appointment:  04-08-16

## 2016-04-07 ENCOUNTER — Telehealth: Payer: Self-pay | Admitting: Dietician

## 2016-04-07 NOTE — Telephone Encounter (Signed)
Called patient to discuss her appointment scheduled for tomorrow, in view of impending inclement weather. Left voicemail message instructing her to call prior to coming to the appointment, in case the office is closed. Also asked her to call if she would like to reschedule the appointment.

## 2016-04-08 ENCOUNTER — Ambulatory Visit: Payer: Medicaid Other | Admitting: Dietician

## 2016-04-16 ENCOUNTER — Encounter: Payer: Medicaid Other | Admitting: Dietician

## 2016-04-16 ENCOUNTER — Encounter: Payer: Self-pay | Admitting: Dietician

## 2016-04-16 DIAGNOSIS — Z713 Dietary counseling and surveillance: Secondary | ICD-10-CM | POA: Diagnosis not present

## 2016-04-16 NOTE — Patient Instructions (Signed)
Include 2 carbohydrate servings at breakfast and range of 2-4 servings (average of 3) at lunch and dinner. Balance meals with protein (2-4 oz), carbohydrate and non-starchy vegetables. Include an evening snack of 1 serving of carbohydrate and 1 ounce of protein. Refer to examples. When eating fruit for a snack, add a protein food such as nuts, peanut butter, cheese or meat. When feeling shaky in evening, check blood sugars. If less than 70, drink 4 oz. Juice and follow with 1 serving of carbohydrate and 1 ounce protein.

## 2016-04-16 NOTE — Progress Notes (Signed)
Gestational Diabetes Program Visit Type:  Follow-up   Appt. Start Time: 1400 Appt. End Time: 1500  04/16/2016  [name], identified by name and date of birth, is a 26 y.o. female with a diagnosis of Diabetes:Gestational diabetes  .   ASSESSMENT  Weight 213 lb 6.4 oz (96.8 kg), last menstrual period 08/14/2015. Body mass index is 40.32 kg/m.       Diabetes Self-Management Education - 04/16/16 1626      Complications   How often do you check your blood sugar? 3-4 times/day   Fasting Blood glucose range (mg/dL) [phone]   Postprandial Blood glucose range (mg/dL) [phone]   Have you had a dilated eye exam in the past 12 months? No   Have you had a dental exam in the past 12 months? No   Are you checking your feet? 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 Chicken, green beans, sweet potatoes or barbeque on bun, spinach salad    Snack (afternoon) fruit; bread   Dinner 6:30pm- grilled chicken, corn, rice or grilled chicken and Ceasar salad or 2 [place]urkey cheese crossiants with cherry tomatoes.   Snack (evening) 9:00pm- peanut butter crackers   Beverage(s) water, milk     Exercise   Exercise Type Light (walking / raking leaves)   How many days per week to you exercise? 3   How many minutes per day do you exercise? 15   Total minutes per week of exercise 45     Patient Education   Nutrition management  Role of diet in the treatment of diabetes and the relationship between the three main macronutrients and blood glucose level;Food label reading, portion sizes and measuring food.;Carbohydrate counting;Reviewed blood glucose goals for pre and post meals and how to evaluate the patients' food intake on their blood glucose level.   Monitoring --  Instructed on retrieving  glucose readings from meter's memory   Acute complications Taught treatment of hypoglycemia - the 15 rule.   Preconception care Reviewed with patient blood glucose goals with pregnancy;Pregnancy and GDM  Role of pre-pregnancy blood glucose control on the development of the fetus;Role of family planning for patients with diabetes     Individualized Goals (developed by patient)   Nutrition Follow meal plan discussed     Post-Education Assessment   Patient understands the diabetes disease and treatment process. Demonstrates understanding / competency   Patient understands incorporating nutritional management into lifestyle. Demonstrates understanding / competency   Patient undertands incorporating physical activity into lifestyle. Demonstrates understanding / competency   Patient understands monitoring blood glucose, interpreting and using results Demonstrates understanding / competency   Patient understands prevention, detection, and treatment of acute complications. Demonstrates understanding / competency   Patient understands how to develop strategies to promote health/change behavior. Demonstrates understanding / competency     Outcomes   Program Status Completed      Learning Objective:  Patient will have a greater understanding of diabetes self-management. Patient education plan is to attend individual and/or group sessions per assessed needs and concerns.  Education: Instructed patient on a meal plan based on 1800-2000 calories including carbohydrate counting and how to better balance carbohydrate, protein and non-starchy vegetables.Wrote out [employer] menu based on patient's food preferences.  .Marland Kitchen  Instructed patient on food safety, including avoidance of Listeriosis, and limiting mercury from fish. Discussed importance of maintaining healthy lifestyle habits to reduce risk of Type 2 DM as well as Gestational DM with any future pregnancies.  Plan:   Patient Instructions  Include 2  carbohydrate servings at breakfast and range of 2-4 servings (average of 3) at lunch and dinner. Balance meals with protein (2-4 oz), carbohydrate and non-starchy vegetables. Include an evening snack of 1 serving of carbohydrate and 1 ounce of protein. Refer to examples. When eating fruit for a snack, add a protein food such as nuts, peanut butter, cheese or meat. When feeling shaky in evening, check blood sugars. If less than 70, drink 4 oz. Juice and follow with 1 serving of carbohydrate and 1 ounce protein.   Expected Outcomes:  Demonstrated interest in learning. Expect positive outcomes  Education material provided:Planning a Balanced Meal, Food Safety handout, sample menus  If problems or questions, patient to contact team via:  Laura [name]  [phone] Future DSME appointment: - PRN

## 2016-04-23 LAB — OB RESULTS CONSOLE GBS: STREP GROUP B AG: NEGATIVE

## 2016-04-23 LAB — OB RESULTS CONSOLE GC/CHLAMYDIA
CHLAMYDIA, DNA PROBE: NEGATIVE
GC PROBE AMP, GENITAL: NEGATIVE

## 2016-05-17 ENCOUNTER — Inpatient Hospital Stay
Admission: EM | Admit: 2016-05-17 | Discharge: 2016-05-19 | DRG: 775 | Disposition: A | Discharge status: Home / Self Care | Payer: Medicaid Other | Attending: Advanced Practice Midwife | Admitting: Advanced Practice Midwife

## 2016-05-17 DIAGNOSIS — O9962 Diseases of the digestive system complicating childbirth: Secondary | ICD-10-CM | POA: Diagnosis present

## 2016-05-17 DIAGNOSIS — O24425 Gestational diabetes mellitus in childbirth, controlled by oral hypoglycemic drugs: Principal | ICD-10-CM | POA: Diagnosis present

## 2016-05-17 DIAGNOSIS — D649 Anemia, unspecified: Secondary | ICD-10-CM | POA: Diagnosis present

## 2016-05-17 DIAGNOSIS — O99214 Obesity complicating childbirth: Secondary | ICD-10-CM | POA: Diagnosis present

## 2016-05-17 DIAGNOSIS — O24429 Gestational diabetes mellitus in childbirth, unspecified control: Secondary | ICD-10-CM

## 2016-05-17 DIAGNOSIS — Z87891 Personal history of nicotine dependence: Secondary | ICD-10-CM | POA: Diagnosis not present

## 2016-05-17 DIAGNOSIS — O9902 Anemia complicating childbirth: Secondary | ICD-10-CM | POA: Diagnosis present

## 2016-05-17 DIAGNOSIS — Z6836 Body mass index (BMI) 36.0-36.9, adult: Secondary | ICD-10-CM

## 2016-05-17 DIAGNOSIS — O99210 Obesity complicating pregnancy, unspecified trimester: Secondary | ICD-10-CM | POA: Diagnosis present

## 2016-05-17 DIAGNOSIS — Z3493 Encounter for supervision of normal pregnancy, unspecified, third trimester: Secondary | ICD-10-CM | POA: Diagnosis present

## 2016-05-17 DIAGNOSIS — Z6841 Body Mass Index (BMI) 40.0 and over, adult: Secondary | ICD-10-CM

## 2016-05-17 DIAGNOSIS — O99211 Obesity complicating pregnancy, first trimester: Secondary | ICD-10-CM | POA: Diagnosis present

## 2016-05-17 DIAGNOSIS — Z3A39 39 weeks gestation of pregnancy: Secondary | ICD-10-CM

## 2016-05-17 DIAGNOSIS — K219 Gastro-esophageal reflux disease without esophagitis: Secondary | ICD-10-CM | POA: Diagnosis present

## 2016-05-17 DIAGNOSIS — O24415 Gestational diabetes mellitus in pregnancy, controlled by oral hypoglycemic drugs: Secondary | ICD-10-CM | POA: Diagnosis present

## 2016-05-17 DIAGNOSIS — E669 Obesity, unspecified: Secondary | ICD-10-CM | POA: Diagnosis present

## 2016-05-17 DIAGNOSIS — O0993 Supervision of high risk pregnancy, unspecified, third trimester: Secondary | ICD-10-CM

## 2016-05-17 DIAGNOSIS — O99213 Obesity complicating pregnancy, third trimester: Secondary | ICD-10-CM | POA: Diagnosis present

## 2016-05-17 HISTORY — DX: Gestational diabetes mellitus in pregnancy, unspecified control: O24.419

## 2016-05-17 LAB — CBC
HCT: 32.4 % — ABNORMAL LOW (ref 35.0–47.0)
HEMOGLOBIN: 10.9 g/dL — AB (ref 12.0–16.0)
MCH: 25.3 pg — ABNORMAL LOW (ref 26.0–34.0)
MCHC: 33.6 g/dL (ref 32.0–36.0)
MCV: 75.3 fL — ABNORMAL LOW (ref 80.0–100.0)
Platelets: 146 10*3/uL — ABNORMAL LOW (ref 150–440)
RBC: 4.3 MIL/uL (ref 3.80–5.20)
RDW: 18 % — ABNORMAL HIGH (ref 11.5–14.5)
WBC: 7.1 10*3/uL (ref 3.6–11.0)

## 2016-05-17 LAB — TYPE AND SCREEN
ABO/RH(D): A POS
ANTIBODY SCREEN: NEGATIVE

## 2016-05-17 LAB — GLUCOSE, CAPILLARY: Glucose-Capillary: 85 mg/dL (ref 65–99)

## 2016-05-17 MED ORDER — ONDANSETRON HCL 4 MG/2ML IJ SOLN: 4.0000 mg | Freq: Four times a day (QID) | INTRAMUSCULAR | Status: DC | PRN

## 2016-05-17 MED ORDER — OXYTOCIN 10 UNIT/ML IJ SOLN
INTRAMUSCULAR | Status: AC
  Filled 2016-05-17: qty 2

## 2016-05-17 MED ORDER — LIDOCAINE HCL (PF) 1 % IJ SOLN
INTRAMUSCULAR | Status: AC
  Filled 2016-05-17: qty 30

## 2016-05-17 MED ORDER — SOD CITRATE-CITRIC ACID 500-334 MG/5ML PO SOLN
30.0000 mL | ORAL | Status: DC | PRN
  Filled 2016-05-17: qty 30

## 2016-05-17 MED ORDER — LACTATED RINGERS IV SOLN
500.0000 mL | INTRAVENOUS | Status: DC | PRN
  Administered 2016-05-17: 1000 mL via INTRAVENOUS
  Administered 2016-05-18: 500 mL via INTRAVENOUS

## 2016-05-17 MED ORDER — OXYTOCIN 40 UNITS IN LACTATED RINGERS INFUSION - SIMPLE MED
2.5000 [IU]/h | INTRAVENOUS | Status: DC
  Administered 2016-05-18: 2.5 [IU]/h via INTRAVENOUS

## 2016-05-17 MED ORDER — TERBUTALINE SULFATE 1 MG/ML IJ SOLN: 0.2500 mg | Freq: Once | INTRAMUSCULAR | Status: DC | PRN

## 2016-05-17 MED ORDER — LIDOCAINE HCL (PF) 1 % IJ SOLN: 30.0000 mL | INTRAMUSCULAR | Status: DC | PRN

## 2016-05-17 MED ORDER — OXYTOCIN BOLUS FROM INFUSION
500.0000 mL | Freq: Once | INTRAVENOUS | Status: AC
  Administered 2016-05-18: 500 mL via INTRAVENOUS

## 2016-05-17 MED ORDER — OXYTOCIN 10 UNIT/ML IJ SOLN: 10.0000 [IU] | Freq: Once | INTRAMUSCULAR | Status: DC

## 2016-05-17 MED ORDER — OXYTOCIN 40 UNITS IN LACTATED RINGERS INFUSION - SIMPLE MED
1.0000 m[IU]/min | INTRAVENOUS | Status: DC
  Administered 2016-05-17: 1 m[IU]/min via INTRAVENOUS
  Filled 2016-05-17: qty 1000

## 2016-05-17 MED ORDER — LACTATED RINGERS IV SOLN
INTRAVENOUS | Status: DC
  Administered 2016-05-17 – 2016-05-18 (×2): via INTRAVENOUS

## 2016-05-17 MED ORDER — MISOPROSTOL 200 MCG PO TABS
ORAL_TABLET | ORAL | Status: AC
  Filled 2016-05-17: qty 4

## 2016-05-17 MED ORDER — AMMONIA AROMATIC IN INHA
RESPIRATORY_TRACT | Status: AC
  Filled 2016-05-17: qty 10

## 2016-05-17 NOTE — H&P (Signed)
OB History & Physical   History of Present Illness:  Chief Complaint: scheduled induction of labor  HPI:  Laura [name] is a 26 y.o. G2P1000 female at [redacted]w[redacted]d dated by LMP consistent with 10 weeks ultrasound.  Her pregnancy has been complicated by obesity with initial BMI 35 (23 pound weight gain this pregnancy), gestational diabetes with need for glyburide, anemia in third trimester.    She reports contractions.   She denies leakage of fluid.   She denies vaginal bleeding.   She reports fetal movement.    Maternal Medical History:   Past Medical History:  Diagnosis Date  . Gestational diabetes     Past Surgical History:  Procedure Laterality Date  . NO PAST SURGERIES     Allergies: No Known Allergies  Prior to Admission medications   Medication Sig Start Date End Date Taking? Authorizing Provider  Prenatal Vit-Fe Fumarate-FA (PRENATAL VITAMIN PO) Take 1 tablet by mouth daily.    Historical Provider, MD    OB History  Gravida Para Term Preterm AB Living  2 1 1         SAB TAB Ectopic Multiple Live Births               # Outcome Date GA Lbr Len/2nd Weight Sex Delivery Anes PTL Lv  2 Current           1 Term               Prenatal care site: [facility]  Social History: She  reports that she has quit smoking. Her smoking use included Cigarettes. She smoked 0.50 packs per day. She quit smokeless tobacco use about 4 years ago. She reports that she does not drink alcohol or use drugs.  Family History: family history includes Cancer in her maternal grandmother and paternal grandmother.   Review of Systems: Negative x 10 systems reviewed except as noted in the HPI.    Physical Exam:  Vital Signs: BP 120/71   Pulse 91   Temp 98.5 F (36.9 C) (Oral)   Resp 18   Ht 5\' 1"  (1.549 m)   Wt 214 lb (97.1 kg)   LMP 08/14/2015 Comment: [redacted] weeks pregnant  BMI 40.43 kg/m  Constitutional: Well nourished, well developed female in no acute distress.  HEENT: normal Skin: Warm  and dry.  Cardiovascular: Regular rate and rhythm.   Extremity: no edema  Respiratory: Clear to auscultation bilateral. Normal respiratory effort Abdomen: FHT present and gravid, non-tender, EFW 9 pounds, cephalic presentation Back: no CVAT Neuro: DTRs 2+, Cranial nerves grossly intact Psych: Alert and Oriented x3. No memory deficits. Normal mood and affect.  MS: normal gait, normal bilateral lower extremity ROM/strength/stability.  Pelvic exam: cvx 4cm per RN   Pertinent Results:  Prenatal Labs: Blood type/Rh A positive  Antibody screen negative  Rubella Immune  Varicella equivocal    RPR NR3  HBsAg negative  HIV negative  GC negative  Chlamydia negative  Genetic screening declined  1 hour GTT N/a (GDMA2), failed  3 hour GTT N/a (GDMA2), failed  GBS negative on 04/23/16   Baseline FHR: 145 beats/min   Variability: moderate   Accelerations: present   Decelerations: present (prolonged late deceleration to 90s x several minutes with return to baseline with position changes) Contractions: present frequency: 1 q 10 min Overall assessment: overall category 1 for the past hour  Last growth u/s 04/30/16 report: growth %ile 85.9%ile (8 pounds, 3,625 grams), AC 97th%ile,   Assessment:  Laura Client  Buford DresserJ [name] is a 26 y.o. G2P1000 female at 2832w4d with IOL for GDMA2.   Plan:  1. Admit to Labor & Delivery  2. CBC, T&S, NPO, IVF 3. GBS negative.   4. Fetwal well-being: category 1, but will monitor closely given deceleration 5. Monitor BG   Conard NovakJackson, Aastha Dayley D, MD 05/17/2016 10:09 PM

## 2016-05-18 ENCOUNTER — Inpatient Hospital Stay: Payer: Medicaid Other | Admitting: Registered Nurse

## 2016-05-18 LAB — GLUCOSE, CAPILLARY
GLUCOSE-CAPILLARY: 101 mg/dL — AB (ref 65–99)
GLUCOSE-CAPILLARY: 91 mg/dL (ref 65–99)
Glucose-Capillary: 89 mg/dL (ref 65–99)
Glucose-Capillary: 101 mg/dL — ABNORMAL HIGH (ref 65–99)
Glucose-Capillary: 135 mg/dL — ABNORMAL HIGH (ref 65–99)
Glucose-Capillary: 129 mg/dL — ABNORMAL HIGH (ref 65–99)

## 2016-05-18 MED ORDER — ONDANSETRON HCL 4 MG/2ML IJ SOLN: 4.0000 mg | INTRAMUSCULAR | Status: DC | PRN

## 2016-05-18 MED ORDER — PRENATAL MULTIVITAMIN CH
1.0000 | ORAL_TABLET | Freq: Every day | ORAL | Status: DC
  Administered 2016-05-19: 1 via ORAL
  Filled 2016-05-18: qty 1

## 2016-05-18 MED ORDER — SIMETHICONE 80 MG PO CHEW
80.0000 mg | CHEWABLE_TABLET | ORAL | Status: DC | PRN
  Administered 2016-05-19: 80 mg via ORAL
  Filled 2016-05-18: qty 1

## 2016-05-18 MED ORDER — TETANUS-DIPHTH-ACELL PERTUSSIS 5-2.5-18.5 LF-MCG/0.5 IM SUSP: 0.5000 mL | Freq: Once | INTRAMUSCULAR | Status: DC

## 2016-05-18 MED ORDER — LIDOCAINE HCL (PF) 1 % IJ SOLN
INTRAMUSCULAR | Status: DC | PRN
  Administered 2016-05-18: 3 mL via SUBCUTANEOUS

## 2016-05-18 MED ORDER — FENTANYL 2.5 MCG/ML W/ROPIVACAINE 0.2% IN NS 100 ML EPIDURAL INFUSION (ARMC-ANES)
EPIDURAL | Status: AC
  Filled 2016-05-18: qty 100

## 2016-05-18 MED ORDER — FENTANYL 2.5 MCG/ML W/ROPIVACAINE 0.2% IN NS 100 ML EPIDURAL INFUSION (ARMC-ANES)
EPIDURAL | Status: DC | PRN
Start: 2016-05-18 — End: 2016-05-19
  Administered 2016-05-18: 10 mL/h via EPIDURAL

## 2016-05-18 MED ORDER — DIPHENHYDRAMINE HCL 50 MG/ML IJ SOLN: 12.5000 mg | INTRAMUSCULAR | Status: DC | PRN

## 2016-05-18 MED ORDER — BUTORPHANOL TARTRATE 1 MG/ML IJ SOLN
INTRAMUSCULAR | Status: AC
  Administered 2016-05-18: 1 mg via INTRAVENOUS
  Filled 2016-05-18: qty 1

## 2016-05-18 MED ORDER — PHENYLEPHRINE 40 MCG/ML (10ML) SYRINGE FOR IV PUSH (FOR BLOOD PRESSURE SUPPORT)
80.0000 ug | PREFILLED_SYRINGE | INTRAVENOUS | Status: DC | PRN
  Filled 2016-05-18: qty 5

## 2016-05-18 MED ORDER — IBUPROFEN 600 MG PO TABS
600.0000 mg | ORAL_TABLET | Freq: Four times a day (QID) | ORAL | Status: DC
  Administered 2016-05-18 – 2016-05-19 (×5): 600 mg via ORAL
  Filled 2016-05-18 (×4): qty 1

## 2016-05-18 MED ORDER — ONDANSETRON HCL 4 MG PO TABS: 4.0000 mg | ORAL_TABLET | ORAL | Status: DC | PRN

## 2016-05-18 MED ORDER — DIBUCAINE 1 % RE OINT: 1.0000 "application " | TOPICAL_OINTMENT | RECTAL | Status: DC | PRN

## 2016-05-18 MED ORDER — BENZOCAINE-MENTHOL 20-0.5 % EX AERO
1.0000 "application " | INHALATION_SPRAY | CUTANEOUS | Status: DC | PRN
  Administered 2016-05-18: 1 via TOPICAL
  Filled 2016-05-18: qty 56

## 2016-05-18 MED ORDER — SENNOSIDES-DOCUSATE SODIUM 8.6-50 MG PO TABS
2.0000 | ORAL_TABLET | ORAL | Status: DC
  Administered 2016-05-19 (×2): 2 via ORAL
  Filled 2016-05-18 (×2): qty 2

## 2016-05-18 MED ORDER — LIDOCAINE-EPINEPHRINE (PF) 1.5 %-1:200000 IJ SOLN
INTRAMUSCULAR | Status: DC | PRN
  Administered 2016-05-18: 3 mL via EPIDURAL

## 2016-05-18 MED ORDER — FENTANYL 2.5 MCG/ML W/ROPIVACAINE 0.2% IN NS 100 ML EPIDURAL INFUSION (ARMC-ANES): 9.0000 mL/h | EPIDURAL | Status: DC

## 2016-05-18 MED ORDER — WITCH HAZEL-GLYCERIN EX PADS: 1.0000 "application " | MEDICATED_PAD | CUTANEOUS | Status: DC | PRN

## 2016-05-18 MED ORDER — EPHEDRINE 5 MG/ML INJ
10.0000 mg | INTRAVENOUS | Status: DC | PRN
  Filled 2016-05-18: qty 2

## 2016-05-18 MED ORDER — IBUPROFEN 600 MG PO TABS
ORAL_TABLET | ORAL | Status: AC
  Filled 2016-05-18: qty 1

## 2016-05-18 MED ORDER — OXYCODONE HCL 5 MG PO TABS
10.0000 mg | ORAL_TABLET | ORAL | Status: DC | PRN
  Administered 2016-05-18 – 2016-05-19 (×6): 10 mg via ORAL
  Filled 2016-05-18 (×5): qty 2

## 2016-05-18 MED ORDER — OXYCODONE HCL 5 MG PO TABS
5.0000 mg | ORAL_TABLET | ORAL | Status: DC | PRN
  Filled 2016-05-18 (×2): qty 1

## 2016-05-18 MED ORDER — ACETAMINOPHEN 325 MG PO TABS: 650.0000 mg | ORAL_TABLET | ORAL | Status: DC | PRN

## 2016-05-18 MED ORDER — FENTANYL CITRATE (PF) 250 MCG/5ML IJ SOLN
9.0000 mL/h | INTRAMUSCULAR | Status: DC
  Filled 2016-05-18: qty 5

## 2016-05-18 MED ORDER — DIPHENHYDRAMINE HCL 25 MG PO CAPS: 25.0000 mg | ORAL_CAPSULE | Freq: Four times a day (QID) | ORAL | Status: DC | PRN

## 2016-05-18 MED ORDER — BUTORPHANOL TARTRATE 1 MG/ML IJ SOLN
1.0000 mg | INTRAMUSCULAR | Status: DC | PRN
  Administered 2016-05-18 (×4): 1 mg via INTRAVENOUS
  Filled 2016-05-18 (×3): qty 1

## 2016-05-18 MED ORDER — LACTATED RINGERS IV SOLN: 500.0000 mL | Freq: Once | INTRAVENOUS | Status: DC

## 2016-05-18 MED ORDER — BUPIVACAINE HCL (PF) 0.25 % IJ SOLN
INTRAMUSCULAR | Status: DC | PRN
  Administered 2016-05-18 (×2): 5 mL via EPIDURAL

## 2016-05-18 MED ORDER — COCONUT OIL OIL
1.0000 "application " | TOPICAL_OIL | Status: DC | PRN
  Administered 2016-05-18: 1 via TOPICAL
  Filled 2016-05-18: qty 120

## 2016-05-18 MED ORDER — BENZOCAINE-MENTHOL 20-0.5 % EX AERO
1.0000 "application " | INHALATION_SPRAY | Freq: Four times a day (QID) | CUTANEOUS | Status: DC | PRN
  Filled 2016-05-18: qty 56

## 2016-05-18 NOTE — Discharge Summary (Signed)
OB Discharge Summary     Patient Name: Laura [name] DOB: 18-Feb-1991 MRN: [id number]  Date of admission: 05/17/2016 Delivering Provider: Obie Dredge  Date of Delivery: 05/18/2016  Date of discharge: 2/272018  Admitting diagnosis: Induction of labor for Gestational Diabetes medication controlled (A2) Intrauterine pregnancy: [redacted]w[redacted]d     Secondary diagnosis: Obesity, Anemia in 3rd trimester     Discharge diagnosis: Term Pregnancy Delivered and GDM A2                                                                                                Post partum procedures:Varivax  Augmentation: Pitocin  Complications: None  Hospital course:  Induction of Labor With Vaginal Delivery   26 y.o. yo G2P1000 at [redacted]w[redacted]d was admitted to the hospital 05/17/2016 for induction of labor.   Indication for induction: A2 DM.  Patient had an uncomplicated labor course as follows: Membrane Rupture Time/Date:   9:08 AM,  05/18/2016 Pt had delivery of viable female at 9:08 AM, 05/18/2016  Details of delivery can be found in separate delivery note.   Patient had a routine postpartum course.  Patient is discharged home 05/19/2016  Physical exam  Vitals:   05/19/16 0038 05/19/16 0332 05/19/16 0711 05/19/16 1148  BP: (!) 102/56 (!) 107/57 113/62 111/71  Pulse: 79 66 67 98  Resp: 20 18 20 20   Temp: 97.9 F (36.6 C) 98.5 F (36.9 C) 98.2 F (36.8 C) 98.1 F (36.7 C)  TempSrc: Oral Oral  Oral  SpO2: 98% 98% 98%   Weight:      Height:       General: alert, cooperative and no distress Lochia: appropriate Uterine Fundus: firm Incision: N/A DVT Evaluation: No evidence of DVT seen on physical exam.  Labs: Lab Results  Component Value Date   WBC 8.9 05/19/2016   HGB 9.9 (L) 05/19/2016   HCT 29.5 (L) 05/19/2016   MCV 75.7 (L) 05/19/2016   PLT 132 (L) 05/19/2016   CMP Latest Ref Rng & Units 09/26/2015  Glucose 65 - 99 mg/dL 99  BUN 6 - 20 mg/dL 11  Creatinine 4.09 - 8.11 mg/dL 9.14  Sodium 782 -  956 mmol/L 135  Potassium 3.5 - 5.1 mmol/L 3.0(L)  Chloride 101 - 111 mmol/L 104  CO2 22 - 32 mmol/L 20(L)  Calcium 8.9 - 10.3 mg/dL 9.1  Total Protein 6.5 - 8.1 g/dL 7.2  Total Bilirubin 0.3 - 1.2 mg/dL 0.3  Alkaline Phos 38 - 126 U/L 55  AST 15 - 41 U/L 30  ALT 14 - 54 U/L 37    Discharge instruction: per After Visit Summary.  Medications:  Allergies as of 05/19/2016   No Known Allergies     Medication List    TAKE these medications   ibuprofen 600 MG tablet Commonly known as:  ADVIL,MOTRIN Take 1 tablet (600 mg total) by mouth every 6 (six) hours as needed.   oxyCODONE 5 MG immediate release tablet Commonly known as:  Oxy IR/ROXICODONE Take 1 tablet (5 mg total) by mouth every 4 (four) hours as needed for moderate pain or severe pain.  PRENATAL VITAMIN PO Take 1 tablet by mouth daily.       Diet: routine diet  Activity: Advance as tolerated. Pelvic rest for 6 weeks.   Outpatient follow up: Follow-up Information    Tresea MallJane Gledhill, CNM. Schedule an appointment as soon as possible for a visit in 6 week(s).   Specialty:  Obstetrics Why:  also can schedule 2 hour GTT and Mirena insertion for same day Contact information: [address] [place] [address] [phone]             Postpartum contraception: IUD Mirena Rhogam Given postpartum: NA Rubella vaccine given postpartum: Rubella Immune Varicella vaccine given postpartum: Varicella Equivocal- vaccine ordered TDaP given antepartum or postpartum: no, last TDAP about 4 years ago  Newborn Data: Live born female Richardson DoppCole Birth Weight: 3912g  APGAR: 8, 9   Baby Feeding: Breast  Disposition:home with mother  SIGNED:  Farrel ConnersColleen Simmone Cape, CNM 05/19/2016 3:40 PM

## 2016-05-18 NOTE — Anesthesia Procedure Notes (Signed)
Epidural Patient location during procedure: OB Start time: 05/18/2016 7:37 AM End time: 05/18/2016 7:47 AM  Staffing Anesthesiologist: Yves DillARROLL, PAUL Resident/CRNA: Karoline CaldwellSTARR, Carrin Vannostrand Performed: resident/CRNA   Preanesthetic Checklist Completed: patient identified, site marked, surgical consent, pre-op evaluation, timeout performed, IV checked, risks and benefits discussed and monitors and equipment checked  Epidural Patient position: sitting Prep: Betadine Patient monitoring: heart rate, continuous pulse ox and blood pressure Approach: midline Location: L4-L5 Injection technique: LOR air  Needle:  Needle type: Tuohy  Needle gauge: 17 G Needle length: 9 cm and 9 Needle insertion depth: 7 cm Catheter type: closed end flexible Catheter size: 19 Gauge Catheter at skin depth: 12 cm Test dose: negative and 1.5% lidocaine with Epi 1:200 K  Assessment Events: blood not aspirated, injection not painful, no injection resistance, negative IV test and no paresthesia  Additional Notes Pt. Evaluated and documentation done after procedure finished. Patient identified. Risks/Benefits/Options discussed with patient including but not limited to bleeding, infection, nerve damage, paralysis, failed block, incomplete pain control, headache, blood pressure changes, nausea, vomiting, reactions to medication both or allergic, itching and postpartum back pain. Confirmed with bedside nurse the patient's most recent platelet count. Confirmed with patient that they are not currently taking any anticoagulation, have any bleeding history or any family history of bleeding disorders. Patient expressed understanding and wished to proceed. All questions were answered. Sterile technique was used throughout the entire procedure. Please see nursing notes for vital signs. Test dose was given through epidural catheter and negative prior to continuing to dose epidural or start infusion. Warning signs of high block given to the  patient including shortness of breath, tingling/numbness in hands, complete motor block, or any concerning symptoms with instructions to call for help. Patient was given instructions on fall risk and not to get out of bed. All questions and concerns addressed with instructions to call with any issues or inadequate analgesia.   Patient tolerated the insertion well without immediate complications.Reason for block:procedure for pain

## 2016-05-18 NOTE — Anesthesia Preprocedure Evaluation (Signed)
Anesthesia Evaluation  Patient identified by MRN, date of birth, ID band Patient awake    Reviewed: Allergy & Precautions, H&P , NPO status , Patient's Chart, lab work & pertinent test results  Airway Mallampati: II  TM Distance: >3 FB Neck ROM: full    Dental  (+) Teeth Intact   Pulmonary neg pulmonary ROS, former smoker,    Pulmonary exam normal        Cardiovascular negative cardio ROS Normal cardiovascular exam     Neuro/Psych negative neurological ROS     GI/Hepatic Neg liver ROS, GERD  Controlled,  Endo/Other  diabetes, Gestational  Renal/GU negative Renal ROS  negative genitourinary   Musculoskeletal   Abdominal   Peds  Hematology negative hematology ROS (+)   Anesthesia Other Findings   Reproductive/Obstetrics (+) Pregnancy                             Anesthesia Physical Anesthesia Plan  ASA: II  Anesthesia Plan: Epidural   Post-op Pain Management:    Induction:   Airway Management Planned:   Additional Equipment:   Intra-op Plan:   Post-operative Plan:   Informed Consent: I have reviewed the patients History and Physical, chart, labs and discussed the procedure including the risks, benefits and alternatives for the proposed anesthesia with the patient or authorized representative who has indicated his/her understanding and acceptance.     Plan Discussed with: Anesthesiologist  Anesthesia Plan Comments:         Anesthesia Quick Evaluation

## 2016-05-19 DIAGNOSIS — O24429 Gestational diabetes mellitus in childbirth, unspecified control: Secondary | ICD-10-CM

## 2016-05-19 LAB — CBC
HCT: 29.5 % — ABNORMAL LOW (ref 35.0–47.0)
Hemoglobin: 9.9 g/dL — ABNORMAL LOW (ref 12.0–16.0)
MCH: 25.4 pg — ABNORMAL LOW (ref 26.0–34.0)
MCHC: 33.6 g/dL (ref 32.0–36.0)
MCV: 75.7 fL — ABNORMAL LOW (ref 80.0–100.0)
Platelets: 132 10*3/uL — ABNORMAL LOW (ref 150–440)
RBC: 3.9 MIL/uL (ref 3.80–5.20)
RDW: 18.1 % — ABNORMAL HIGH (ref 11.5–14.5)
WBC: 8.9 10*3/uL (ref 3.6–11.0)

## 2016-05-19 LAB — RPR, QUANT+TP ABS (REFLEX)
Rapid Plasma Reagin, Quant: 1:1 {titer} — ABNORMAL HIGH
T Pallidum Abs: NEGATIVE

## 2016-05-19 LAB — RPR: RPR Ser Ql: REACTIVE — AB

## 2016-05-19 MED ORDER — FERROUS FUMARATE 324 (106 FE) MG PO TABS
1.0000 | ORAL_TABLET | Freq: Every day | ORAL | Status: DC
  Administered 2016-05-19: 106 mg via ORAL
  Filled 2016-05-19: qty 1

## 2016-05-19 MED ORDER — IBUPROFEN 600 MG PO TABS: 600.0000 mg | ORAL_TABLET | Freq: Four times a day (QID) | ORAL | 1 refills | Status: DC | PRN

## 2016-05-19 MED ORDER — VARICELLA VIRUS VACCINE LIVE 1350 PFU/0.5ML IJ SUSR
0.5000 mL | Freq: Once | INTRAMUSCULAR | Status: AC
  Administered 2016-05-19: 0.5 mL via SUBCUTANEOUS
  Filled 2016-05-19 (×2): qty 0.5

## 2016-05-19 MED ORDER — OXYCODONE HCL 5 MG PO TABS: 5.0000 mg | ORAL_TABLET | ORAL | 0 refills | Status: DC | PRN

## 2016-05-19 NOTE — Discharge Instructions (Signed)
Vaginal Delivery, Care After Refer to this sheet in the next few weeks. These discharge instructions provide you with information on caring for yourself after delivery. Your caregiver may also give you specific instructions. Your treatment has been planned according to the most current medical practices available, but problems sometimes occur. Call your caregiver if you have any problems or questions after you go home. HOME CARE INSTRUCTIONS 1. Take over-the-counter or prescription medicines only as directed by your caregiver or pharmacist. 2. Do not drink alcohol, especially if you are breastfeeding or taking medicine to relieve pain. 3. Do not smoke tobacco. 4. Continue to use good perineal care. Good perineal care includes: 1. Wiping your perineum from back to front 2. Keeping your perineum clean. 3. You can do sitz baths twice a day, to help keep this area clean 5. Do not use tampons, douche or have sex until your caregiver says it is okay. 6. Shower only and avoid sitting in submerged water, aside from sitz baths 7. Wear a well-fitting bra that provides breast support. 8. Eat healthy foods. 9. Drink enough fluids to keep your urine clear or pale yellow. 10. Eat high-fiber foods such as whole grain cereals and breads, brown rice, beans, and fresh fruits and vegetables every day. These foods may help prevent or relieve constipation. 11. Avoid constipation with high fiber foods or medications, such as miralax or metamucil 12. Follow your caregiver's recommendations regarding resumption of activities such as climbing stairs, driving, lifting, exercising, or traveling. 13. Talk to your caregiver about resuming sexual activities. Resumption of sexual activities is dependent upon your risk of infection, your rate of healing, and your comfort and desire to resume sexual activity. 14. Try to have someone help you with your household activities and your newborn for at least a few days after you leave  the hospital. 15. Rest as much as possible. Try to rest or take a nap when your newborn is sleeping. 16. Increase your activities gradually. 17. Keep all of your scheduled postpartum appointments. It is very important to keep your scheduled follow-up appointments. At these appointments, your caregiver will be checking to make sure that you are healing physically and emotionally. SEEK MEDICAL CARE IF:   You are passing large clots from your vagina. Save any clots to show your caregiver.  You have a foul smelling discharge from your vagina.  You have trouble urinating.  You are urinating frequently.  You have pain when you urinate.  You have a change in your bowel movements.  You have increasing redness, pain, or swelling near your vaginal incision (episiotomy) or vaginal tear.  You have pus draining from your episiotomy or vaginal tear.  Your episiotomy or vaginal tear is separating.  You have painful, hard, or reddened breasts.  You have a severe headache.  You have blurred vision or see spots.  You feel sad or depressed.  You have thoughts of hurting yourself or your newborn.  You have questions about your care, the care of your newborn, or medicines.  You are dizzy or light-headed.  You have a rash.  You have nausea or vomiting.  You were breastfeeding and have not had a menstrual period within 12 weeks after you stopped breastfeeding.  You are not breastfeeding and have not had a menstrual period by the 12th week after delivery.  You have a fever. SEEK IMMEDIATE MEDICAL CARE IF:   You have persistent pain.  You have chest pain.  You have shortness of breath.    You faint.  You have leg pain.  You have stomach pain.  Your vaginal bleeding saturates two or more sanitary pads in 1 hour. MAKE SURE YOU:   Understand these instructions.  Will watch your condition.  Will get help right away if you are not doing well or get worse. Document Released:  03/06/2000 Document Revised: 07/24/2013 Document Reviewed: 11/04/2011 ExitCare Patient Information 2015 [employer]. This information is not intended to replace advice given to you by your health care provider. Make sure you discuss any questions you have with your health care provider.  Sitz Bath A sitz bath is a warm water bath taken in the sitting position. The water covers only the hips and butt (buttocks). We recommend using one that fits in the toilet, to help with ease of use and cleanliness. It may be used for either healing or cleaning purposes. Sitz baths are also used to relieve pain, itching, or muscle tightening (spasms). The water may contain medicine. Moist heat will help you heal and relax.  HOME CARE  Take 3 to 4 sitz baths a day. 18. Fill the bathtub half-full with warm water. 19. Sit in the water and open the drain a little. 20. Turn on the warm water to keep the tub half-full. Keep the water running constantly. 21. Soak in the water for 15 to 20 minutes. 22. After the sitz bath, pat the affected area dry. GET HELP RIGHT AWAY IF: You get worse instead of better. Stop the sitz baths if you get worse. MAKE SURE YOU:  Understand these instructions.  Will watch your condition.  Will get help right away if you are not doing well or get worse. Document Released: 04/16/2004 Document Revised: 12/02/2011 Document Reviewed: 07/07/2010 ExitCare Patient Information 2015 [employer]. This information is not intended to replace advice given to you by your health care provider. Make sure you discuss any questions you have with your health care provider.    

## 2016-05-19 NOTE — Anesthesia Postprocedure Evaluation (Signed)
Anesthesia Post Note  Patient: Laura [name]  Procedure(s) Performed: * No procedures listed *  Patient location during evaluation: Mother Baby Anesthesia Type: Epidural Level of consciousness: awake, awake and alert and oriented Pain management: pain level controlled Vital Signs Assessment: post-procedure vital signs reviewed and stable Respiratory status: spontaneous breathing Cardiovascular status: stable Postop Assessment: no headache, no backache, epidural receding and adequate PO intake Anesthetic complications: no     Last Vitals:  Vitals:   05/19/16 0332 05/19/16 0711  BP: (!) 107/57 113/62  Pulse: 66 67  Resp: 18 20  Temp: 36.9 C 36.8 C    Last Pain:  Vitals:   05/19/16 0656  TempSrc:   PainSc: 8                  Zachary GeorgeWeatherly,  Maycol Hoying F

## 2016-05-19 NOTE — Progress Notes (Signed)
Patient discharge to home via wheelchair with spouse and baby in car seat. Discharge instructions reviewed with patient.  All questions answered.  

## 2016-05-19 NOTE — Progress Notes (Signed)
Post Partum Day 1. IOL for GDMA2 Subjective: up ad lib, voiding, tolerating PO and sore perineum. Cramping relieved with analgesics. Baby feeding well. Pediatrician wants to observe baby for another day due to heart murmur  Objective: Blood pressure 113/62, pulse 67, temperature 98.2 F (36.8 C), resp. rate 20, height 5\' 1"  (1.549 m), weight 97.1 kg (214 lb), last menstrual period 08/14/2015, SpO2 98 %, unknown if currently breastfeeding.  Physical Exam: BP 113/62 (BP Location: Right Arm)   Pulse 67   Temp 98.2 F (36.8 C)   Resp 20   Ht 5\' 1"  (1.549 m)   Wt 97.1 kg (214 lb)   LMP 08/14/2015 Comment: [redacted] weeks pregnant  SpO2 98%   Breastfeeding? Unknown   BMI 40.43 kg/m   Blood sugars pp 129-135 General: alert, cooperative and no distress Lochia: appropriate Uterine Fundus: firm at U to U+1/ML/NT Perineum: no edema of labia DVT Evaluation: No evidence of DVT seen on physical exam.   Recent Labs  05/17/16 2107 05/19/16 0413  HGB 10.9* 9.9*  HCT 32.4* 29.5*  WBC 7.1 8.9  PLT 146* 132*    Assessment/Plan: Stable PPD #1 Breastfeeding  A POS/ RI/ Varicella equivocal/ GBS neg FSBS in AM Needs 2 HR GTT at 6 weeks Plan for discharge tomorrow. Anemia: begin iron supplementation  Laura [name], CNM   LOS: 2 days   Laura [name] 05/19/2016, 8:26 AM

## 2016-05-27 ENCOUNTER — Telehealth: Payer: Self-pay | Admitting: Advanced Practice Midwife

## 2016-05-27 NOTE — Telephone Encounter (Signed)
Please advise 

## 2016-05-27 NOTE — Telephone Encounter (Signed)
PT is called to schedule her lab and 6 week pp. And states she has been having severe Back pain and Swelling . Please advise work in. [phone]

## 2016-06-08 NOTE — Care Management (Signed)
Pt. Referred to OBCM services. Pt. Currently has a MIIS (Maternal Infant Impactability Score) less than 200.  Pt. Currently not eligible for OBCM services at this time.  Should issues arise during pregnancy, please re-refer pt. Back to OBCM services or contact Dorena DewMarilyn Sallee Hogrefe BA OBCM at [phone].

## 2016-07-01 ENCOUNTER — Encounter: Payer: Self-pay | Admitting: Advanced Practice Midwife

## 2016-07-01 ENCOUNTER — Other Ambulatory Visit: Payer: Medicaid Other

## 2016-07-01 DIAGNOSIS — O24429 Gestational diabetes mellitus in childbirth, unspecified control: Secondary | ICD-10-CM

## 2016-07-02 LAB — GLUCOSE TOLERANCE, 2 HOURS W/ 1HR
GLUCOSE, 1 HOUR: 86 mg/dL (ref 65–179)
GLUCOSE, FASTING: 87 mg/dL (ref 65–91)
Glucose, 2 hour: 80 mg/dL (ref 65–152)

## 2016-07-30 ENCOUNTER — Ambulatory Visit: Payer: Medicaid Other | Admitting: Advanced Practice Midwife

## 2016-08-13 ENCOUNTER — Ambulatory Visit (INDEPENDENT_AMBULATORY_CARE_PROVIDER_SITE_OTHER): Payer: Medicaid Other | Admitting: Advanced Practice Midwife

## 2016-08-13 ENCOUNTER — Encounter: Payer: Self-pay | Admitting: Advanced Practice Midwife

## 2016-08-13 VITALS — BP 112/58 | HR 92 | Wt 191.0 lb

## 2016-08-13 DIAGNOSIS — Z308 Encounter for other contraceptive management: Secondary | ICD-10-CM | POA: Diagnosis not present

## 2016-08-13 DIAGNOSIS — Z30431 Encounter for routine checking of intrauterine contraceptive device: Secondary | ICD-10-CM

## 2016-08-13 NOTE — Progress Notes (Signed)
     Obstetrics & Gynecology Office Visit   Chief Complaint:  Chief Complaint  Patient presents with  . IUD string check    History of Present Illness: 26 y.o. patient presenting for follow up of Mirena IUD placement 5 weeks ago.  She denies any complications since her IUD placement.  Still having some occasional spotting. She is able to feel strings.    Review of Systems: Review of Systems  Constitutional: Negative.   HENT: Negative.   Eyes: Negative.   Respiratory: Negative.   Cardiovascular: Negative.   Gastrointestinal: Negative.   Genitourinary: Negative.   Musculoskeletal: Negative.   Skin: Negative.   Neurological: Negative.   Endo/Heme/Allergies: Negative.   Psychiatric/Behavioral: Negative.      Past Medical History:  Past Medical History:  Diagnosis Date  . Gestational diabetes     Past Surgical History:  Past Surgical History:  Procedure Laterality Date  . NO PAST SURGERIES      Gynecologic History: Patient's last menstrual period was 07/03/2016 (approximate).  Obstetric History: 832P2001  Family History:  Family History  Problem Relation Age of Onset  . Cancer Paternal Grandmother   . Cancer Maternal Grandmother     Social History:  Social History   Social History  . Marital status: Single    Spouse name: N/A  . Number of children: N/A  . Years of education: N/A   Occupational History  . Not on file.   Social History Main Topics  . Smoking status: Former Smoker    Packs/day: 0.50    Types: Cigarettes  . Smokeless tobacco: Former NeurosurgeonUser    Quit date: 04/21/2012  . Alcohol use No  . Drug use: No  . Sexual activity: Yes   Other Topics Concern  . Not on file   Social History Narrative  . No narrative on file    Allergies:  No Known Allergies  Medications: Prior to Admission medications   Medication Sig Start Date End Date Taking? Authorizing Provider  ibuprofen (ADVIL,MOTRIN) 600 MG tablet Take 1 tablet (600 mg total) by mouth  every 6 (six) hours as needed. 05/19/16   Farrel ConnersGutierrez, Colleen, CNM  oxyCODONE (OXY IR/ROXICODONE) 5 MG immediate release tablet Take 1 tablet (5 mg total) by mouth every 4 (four) hours as needed for moderate pain or severe pain. 05/19/16   Farrel ConnersGutierrez, Colleen, CNM  Prenatal Vit-Fe Fumarate-FA (PRENATAL VITAMIN PO) Take 1 tablet by mouth daily.    [provider]    Physical Exam Vitals:  Vitals:   08/13/16 1134  BP: (!) 112/58  Pulse: 92   Patient's last menstrual period was 07/03/2016 (approximate).  General: NAD HEENT: normocephalic, anicteric Pulmonary: No increased work of breathing Cardiovascular: RRR, distal pulses 2+ Genitourinary:  External: Normal external female genitalia.  Normal urethral meatus, normal  Bartholin's and Skene's glands.    Vagina: Normal vaginal mucosa, no evidence of prolapse.    Cervix: Grossly normal in appearance, no bleeding, IUD strings visualized, strings trimmed to 2cm.  Uterus: Non-enlarged, mobile, normal contour.  No CMT  Adnexa: ovaries non-enlarged, no adnexal masses  Rectal: deferred  Lymphatic: no evidence of inguinal lymphadenopathy Extremities: no edema, erythema, or tenderness Neurologic: Grossly intact Psychiatric: mood appropriate, affect full   Assessment: 26 y.o. G2P2001 IUD string check Plan: Problem List Items Addressed This Visit    None     Return to clinic in 1 year for annual exam  Tresea MallJane Analysa Nutting, CNM

## 2016-09-18 ENCOUNTER — Encounter: Payer: Self-pay | Admitting: Emergency Medicine

## 2016-09-18 ENCOUNTER — Ambulatory Visit
Admission: EM | Admit: 2016-09-18 | Discharge: 2016-09-18 | Disposition: A | Discharge status: Home / Self Care | Payer: Self-pay | Attending: Family Medicine | Admitting: Family Medicine

## 2016-09-18 ENCOUNTER — Ambulatory Visit: Payer: Self-pay

## 2016-09-18 DIAGNOSIS — F17210 Nicotine dependence, cigarettes, uncomplicated: Secondary | ICD-10-CM | POA: Insufficient documentation

## 2016-09-18 DIAGNOSIS — S92491A Other fracture of right great toe, initial encounter for closed fracture: Secondary | ICD-10-CM

## 2016-09-18 DIAGNOSIS — S92499A Other fracture of unspecified great toe, initial encounter for closed fracture: Secondary | ICD-10-CM

## 2016-09-18 DIAGNOSIS — W19XXXA Unspecified fall, initial encounter: Secondary | ICD-10-CM

## 2016-09-18 DIAGNOSIS — W109XXA Fall (on) (from) unspecified stairs and steps, initial encounter: Secondary | ICD-10-CM | POA: Insufficient documentation

## 2016-09-18 DIAGNOSIS — S92401A Displaced unspecified fracture of right great toe, initial encounter for closed fracture: Secondary | ICD-10-CM | POA: Insufficient documentation

## 2016-09-18 DIAGNOSIS — Z79899 Other long term (current) drug therapy: Secondary | ICD-10-CM | POA: Insufficient documentation

## 2016-09-18 MED ORDER — KETOROLAC TROMETHAMINE 60 MG/2ML IM SOLN: 60.0000 mg | Freq: Once | INTRAMUSCULAR | Status: DC

## 2016-09-18 MED ORDER — MELOXICAM 15 MG PO TABS: 15.0000 mg | ORAL_TABLET | Freq: Every day | ORAL | 0 refills | Status: DC

## 2016-09-18 MED ORDER — OXYCODONE-ACETAMINOPHEN 5-325 MG PO TABS: 1.0000 | ORAL_TABLET | Freq: Three times a day (TID) | ORAL | 0 refills | Status: DC | PRN

## 2016-09-18 MED ORDER — OXYCODONE-ACETAMINOPHEN 7.5-325 MG PO TABS: 1.0000 | ORAL_TABLET | Freq: Three times a day (TID) | ORAL | 0 refills | Status: DC | PRN

## 2016-09-18 NOTE — Discharge Instructions (Signed)
Need follow-up with podiatrist

## 2016-09-18 NOTE — ED Provider Notes (Signed)
[facility]    CSN: [id number] Arrival date & time: 09/18/16  1318     History   Chief Complaint Chief Complaint  Patient presents with  . Toe Pain    HPI Laura [name] is a 26 y.o. female.   Patient is 26 year old white female who states that she was walking down some steps holding her baby in the car seat when she tripped over 20 left by her of this child. She states the baby went flying and she wound up landing on her toe and cracking her toe. She reports pain in the right foot and over the big right toe. Past smoker history she does smoke. No previous surgeries or operations. No known drug allergies. No pertinent family medical history relevant to today's visit. She states she tripped this morning.   The history is provided by the patient. No language interpreter was used.  Toe Pain  This is a new problem. The current episode started 3 to 5 hours ago. Pertinent negatives include no chest pain, no abdominal pain and no headaches. Nothing aggravates the symptoms. Nothing relieves the symptoms. She has tried nothing for the symptoms.    Past Medical History:  Diagnosis Date  . Gestational diabetes     Patient Active Problem List   Diagnosis Date Noted  . Gestational diabetes mellitus (GDM), delivered 05/19/2016  . Postpartum care following vaginal delivery 05/18/2016  . BMI 36.0-36.9,adult 05/17/2016  . Indication for care in labor and delivery, antepartum 03/24/2016    Past Surgical History:  Procedure Laterality Date  . NO PAST SURGERIES      OB History    Gravida Para Term Preterm AB Living   2 2 2     1    SAB TAB Ectopic Multiple Live Births         0 1       Home Medications    Prior to Admission medications   Medication Sig Start Date End Date Taking? Authorizing Provider  ibuprofen (ADVIL,MOTRIN) 600 MG tablet Take 1 tablet (600 mg total) by mouth every 6 (six) hours as needed. 05/19/16   Farrel Conners, CNM  meloxicam (MOBIC) 15 MG  tablet Take 1 tablet (15 mg total) by mouth daily. 09/18/16   Hassan Rowan, MD  oxyCODONE (OXY IR/ROXICODONE) 5 MG immediate release tablet Take 1 tablet (5 mg total) by mouth every 4 (four) hours as needed for moderate pain or severe pain. 05/19/16   Farrel Conners, CNM  oxyCODONE-acetaminophen (ROXICET) 5-325 MG tablet Take 1 tablet by mouth every 8 (eight) hours as needed for severe pain. 09/18/16   Hassan Rowan, MD  Prenatal Vit-Fe Fumarate-FA (PRENATAL VITAMIN PO) Take 1 tablet by mouth daily.    [provider]    Family History Family History  Problem Relation Age of Onset  . Cancer Paternal Grandmother   . Cancer Maternal Grandmother     Social History Social History  Substance Use Topics  . Smoking status: Current Every Day Smoker    Packs/day: 0.50    Types: Cigarettes  . Smokeless tobacco: Former Neurosurgeon    Quit date: 04/21/2012  . Alcohol use No     Allergies   Patient has no known allergies.   Review of Systems Review of Systems  Cardiovascular: Negative for chest pain.  Gastrointestinal: Negative for abdominal pain.  Musculoskeletal: Positive for myalgias and neck stiffness.  Neurological: Negative for headaches.  All other systems reviewed and are negative.    Physical Exam  Triage Vital Signs ED Triage Vitals  Enc Vitals Group     BP 09/18/16 1358 129/77     Pulse Rate 09/18/16 1358 69     Resp 09/18/16 1358 16     Temp 09/18/16 1358 98.2 F (36.8 C)     Temp Source 09/18/16 1358 Oral     SpO2 09/18/16 1358 100 %     Weight 09/18/16 1358 185 lb (83.9 kg)     Height 09/18/16 1358 5\' 1"  (1.549 m)     Head Circumference --      Peak Flow --      Pain Score 09/18/16 1359 10     Pain Loc --      Pain Edu? --      Excl. in GC? --    No data found.   Updated Vital Signs BP 129/77 (BP Location: Right Arm)   Pulse 69   Temp 98.2 F (36.8 C) (Oral)   Resp 16   Ht 5\' 1"  (1.549 m)   Wt 185 lb (83.9 kg)   SpO2 100%   BMI 34.96 kg/m    Visual Acuity Right Eye Distance:   Left Eye Distance:   Bilateral Distance:    Right Eye Near:   Left Eye Near:    Bilateral Near:     Physical Exam  Constitutional: She appears well-developed and well-nourished.  HENT:  Head: Normocephalic and atraumatic.  Eyes: Pupils are equal, round, and reactive to light.  Neck: Neck supple.  Pulmonary/Chest: Effort normal.  Musculoskeletal: She exhibits tenderness.       Feet:  Patient with marked tenderness over right toe. X-rays will be done  Neurological: She is alert.  Skin: There is erythema.  Psychiatric: She has a normal mood and affect.  Vitals reviewed.    UC Treatments / Results  Labs (all labs ordered are listed, but only abnormal results are displayed) Labs Reviewed - No data to display  EKG  EKG Interpretation None       Radiology Dg Toe Great Right  Result Date: 09/18/2016 CLINICAL DATA:  26 year old female status post fall down steps today with great toe pain. EXAM: RIGHT GREAT TOE COMPARISON:  None. FINDINGS: Comminuted intra-articular fracture of the right great toe distal phalanx with minimal displacement. First IP joint involvement. Soft tissue swelling. The right first proximal phalanx and metatarsal appear intact. Other visible osseous structures appear intact. IMPRESSION: Comminuted and intra-articular but minimally displaced fracture of the right great toe distal phalanx. Electronically Signed   By: Odessa FlemingH  Hall M.D.   On: 09/18/2016 14:51    Procedures Procedures (including critical care time)  Medications Ordered in UC Medications  ketorolac (TORADOL) injection 60 mg (60 mg Intramuscular Not Given 09/18/16 1601)     Initial Impression / Assessment and Plan / UC Course  I have reviewed the triage vital signs and the nursing notes.  Pertinent labs & imaging results that were available during my care of the patient were reviewed by me and considered in my medical decision making (see chart for  details).     X-ray done on the right toe showing abnormal multiple fractures of the distal phalanx of the right toe. Because of the amount of fractures present will place patient in a cam boot buddy tape the toes together. Initially there wasindication which show she had but she does have Medicaid so instead of her going to a podiatrist on her own she will need a referral for podiatry through her  PCP. Initially she states the pain was exquisite to she agreed to Toradol injection has informed me that Percocet does work well for her. She was pulled up at the [facility] drug reporting site and found to only had Percocet earlier this year February apparently at childbirth. Initially 7.5 mg 15 tablets were ordered which she declined her Toradol injection the assumption is that the pain is not as severe as she states that we change to 5/325 less than 12 will be given to her with follow-up for PCP       Final diagnoses:  Other fracture of unspecified great toe, initial encounter for closed fracture    New Prescriptions Discharge Medication List as of 09/18/2016  3:57 PM    START taking these medications   Details  meloxicam (MOBIC) 15 MG tablet Take 1 tablet (15 mg total) by mouth daily., Starting Fri 09/18/2016, Normal    oxyCODONE-acetaminophen (ROXICET) 5-325 MG tablet Take 1 tablet by mouth every 8 (eight) hours as needed for severe pain., Starting Fri 09/18/2016, Normal         Note: This dictation was prepared with Dragon dictation along with smaller phrase technology. Any transcriptional errors that result from this process are unintentional.    Hassan Rowan, MD 09/18/16 1657

## 2016-09-18 NOTE — ED Triage Notes (Signed)
Pt reports tripped over toy and injured right great toe. Pt reports sever pain and difficulty walking

## 2017-02-25 ENCOUNTER — Ambulatory Visit
Admission: EM | Admit: 2017-02-25 | Discharge: 2017-02-25 | Disposition: A | Discharge status: Home / Self Care | Payer: Self-pay | Attending: Family Medicine | Admitting: Family Medicine

## 2017-02-25 ENCOUNTER — Encounter: Payer: Self-pay | Admitting: *Deleted

## 2017-02-25 DIAGNOSIS — J0100 Acute maxillary sinusitis, unspecified: Secondary | ICD-10-CM

## 2017-02-25 DIAGNOSIS — R05 Cough: Secondary | ICD-10-CM | POA: Insufficient documentation

## 2017-02-25 DIAGNOSIS — B309 Viral conjunctivitis, unspecified: Secondary | ICD-10-CM

## 2017-02-25 DIAGNOSIS — Z79899 Other long term (current) drug therapy: Secondary | ICD-10-CM | POA: Insufficient documentation

## 2017-02-25 DIAGNOSIS — R059 Cough, unspecified: Secondary | ICD-10-CM

## 2017-02-25 DIAGNOSIS — F17210 Nicotine dependence, cigarettes, uncomplicated: Secondary | ICD-10-CM | POA: Insufficient documentation

## 2017-02-25 LAB — RAPID STREP SCREEN (MED CTR MEBANE ONLY): STREPTOCOCCUS, GROUP A SCREEN (DIRECT): NEGATIVE

## 2017-02-25 MED ORDER — BENZONATATE 100 MG PO CAPS: 100.0000 mg | ORAL_CAPSULE | Freq: Three times a day (TID) | ORAL | 0 refills | Status: DC | PRN

## 2017-02-25 MED ORDER — POLYMYXIN B-TRIMETHOPRIM 10000-0.1 UNIT/ML-% OP SOLN: 1.0000 [drp] | OPHTHALMIC | 0 refills | Status: DC

## 2017-02-25 MED ORDER — AMOXICILLIN-POT CLAVULANATE 875-125 MG PO TABS: 1.0000 | ORAL_TABLET | Freq: Two times a day (BID) | ORAL | 0 refills | Status: DC

## 2017-02-25 MED ORDER — HYDROCOD POLST-CPM POLST ER 10-8 MG/5ML PO SUER: 5.0000 mL | Freq: Every evening | ORAL | 0 refills | Status: DC | PRN

## 2017-02-25 NOTE — ED Triage Notes (Signed)
Sore throat, productive cough- green, chest congestion, x2 weeks. Awoke this am with eyes matted closed. 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

## 2017-02-25 NOTE — Discharge Instructions (Signed)
Take medication as prescribed. Rest. Drink plenty of fluids.  ° °Follow up with your primary care physician this week as needed. Return to Urgent care for new or worsening concerns.  ° °

## 2017-02-25 NOTE — ED Provider Notes (Addendum)
[facility] ____________________________________________  Time seen: Approximately 9:19 AM  I have reviewed the triage vital signs and the nursing notes.   HISTORY  Chief Complaint Sore Throat; Cough; and Eye Problem   HPI Laura [name] is a 26 y.o. female presenting for evaluation of 2 weeks of runny nose, nasal congestion, sinus pressure, intermittent sore throat and intermittent cough.  States sore throat feels more irritation from coughing.  Reports continues to eat and drink well.  States symptoms have been unresolved with over-the-counter Mucinex.  States that she is a Runner, broadcasting/film/videoteacher and frequently around 6 units as well as recently had a student with conjunctivitis.  Reports that this morning she noticed that both of her eyes were red and matted shut.  Denies any vision changes, eye pain, chemical exposure or foreign body sensation.  Does not wear glasses or contacts.  Reports blowing nose and getting very thick drainage out.  Denies accompanying fevers.  Denies other aggravating or alleviating factors.  Reports otherwise feels well. Denies chest pain, shortness of breath, hemoptysis, abdominal pain, dysuria, or rash. Denies recent sickness. Denies recent antibiotic use. Denies pregnancy.  Past Medical History:  Diagnosis Date  . Gestational diabetes     Patient Active Problem List   Diagnosis Date Noted  . Gestational diabetes mellitus (GDM), delivered 05/19/2016  . Postpartum care following vaginal delivery 05/18/2016  . BMI 36.0-36.9,adult 05/17/2016  . Indication for care in labor and delivery, antepartum 03/24/2016    Past Surgical History:  Procedure Laterality Date  . NO PAST SURGERIES       No current facility-administered medications for this encounter.   Current Outpatient Medications:  .  amoxicillin-clavulanate (AUGMENTIN) 875-125 MG tablet, Take 1 tablet by mouth every 12 (twelve) hours., Disp: 20 tablet, Rfl: 0 .  benzonatate (TESSALON PERLES) 100  MG capsule, Take 1 capsule (100 mg total) by mouth 3 (three) times daily as needed for cough., Disp: 15 capsule, Rfl: 0 .  chlorpheniramine-HYDROcodone (TUSSIONEX PENNKINETIC ER) 10-8 MG/5ML SUER, Take 5 mLs by mouth at bedtime as needed for cough. do not drive or operate machinery while taking as can cause drowsiness., Disp: 75 mL, Rfl: 0 .  ibuprofen (ADVIL,MOTRIN) 600 MG tablet, Take 1 tablet (600 mg total) by mouth every 6 (six) hours as needed., Disp: 30 tablet, Rfl: 1 .  trimethoprim-polymyxin b (POLYTRIM) ophthalmic solution, Place 1 drop into both eyes every 4 (four) hours. For 7 days, Disp: 10 mL, Rfl: 0  Allergies Patient has no known allergies.  Family History  Problem Relation Age of Onset  . Cancer Paternal Grandmother   . Cancer Maternal Grandmother     Social History Social History   Tobacco Use  . Smoking status: Current Every Day Smoker    Packs/day: 0.50    Types: Cigarettes  . Smokeless tobacco: Former NeurosurgeonUser    Quit date: 04/21/2012  Substance Use Topics  . Alcohol use: No  . Drug use: No    Review of Systems Constitutional: No fever/chills Eyes: No visual changes. ENT: As above.  Cardiovascular: Denies chest pain. Respiratory: Denies shortness of breath. Gastrointestinal: No abdominal pain.  Musculoskeletal: Negative for back pain. Skin: Negative for rash.   ____________________________________________   PHYSICAL EXAM:  VITAL SIGNS: ED Triage Vitals  Enc Vitals Group     BP 02/25/17 0912 121/61     Pulse Rate 02/25/17 0912 78     Resp 02/25/17 0912 16     Temp 02/25/17 0912 98.3 F (36.8 C)  Temp Source 02/25/17 0912 Oral     SpO2 02/25/17 0912 100 %     Weight 02/25/17 0913 181 lb (82.1 kg)     Height 02/25/17 0913 5\' 1"  (1.549 m)     Head Circumference --      Peak Flow --      Pain Score --      Pain Loc --      Pain Edu? --      Excl. in GC? --    Constitutional: Alert and oriented. Well appearing and in no acute distress. Eyes:  Mild bilateral conjunctival injection.  No active drainage.  No surrounding erythema, swelling or tenderness.  No foreign bodies visualized bilaterally.PERRL. Head: Atraumatic.Mild to moderate tenderness to palpation bilateral maxillary sinuses. Nontender frontal sinuses. No swelling. No erythema.   Ears: no erythema, normal TMs bilaterally.   Nose: nasal congestion with bilateral nasal turbinate erythema and edema.   Mouth/Throat: Mucous membranes are [email or web address].mild pharyngeal erythema. No tonsillar swelling or exudate.  Neck: No stridor.  No cervical spine tenderness to palpation. Hematological/Lymphatic/Immunilogical: No cervical lymphadenopathy. Cardiovascular: Normal rate, regular rhythm. Grossly normal heart sounds.  Good peripheral circulation. Respiratory: Normal respiratory effort.  No retractions.No wheezes, rales or rhonchi. Good air movement. . Musculoskeletal: No cervical, thoracic or lumbar tenderness to palpation.  Neurologic:  Normal speech and language. No gait instability. Skin:  Skin is warm, dry and intact. No rash noted. Psychiatric: Mood and affect are normal. Speech and behavior are normal.  ___________________________________________   LABS (all labs ordered are listed, but only abnormal results are displayed)  Labs Reviewed  RAPID STREP SCREEN (NOT AT [facility])  CULTURE, GROUP A STREP [facility])    PROCEDURES Procedures   INITIAL IMPRESSION / ASSESSMENT AND PLAN / ED COURSE  Pertinent labs & imaging results that were available during my care of the patient were reviewed by me and considered in my medical decision making (see chart for details).  Well-appearing patient.  No acute distress.  Suspect maxillary sinusitis, postnasal drainage cough and viral conjunctivitis.  Will treat patient with oral Augmentin, appearing Tessalon Perles and as needed Tussionex.  Encourage rest, fluids, supportive care.  Discussed close monitoring eye redness and drainage, as patient has had  positive pinkeye exposure, discussed closely monitoring and  starting Polytrim drops for any drainage.  Encourage close follow-up for any change in complaints.Discussed indication, risks and benefits of medications with patient.  Discussed follow up with Primary care physician this week. Discussed follow up and return parameters including no resolution or any worsening concerns. Patient verbalized understanding and agreed to plan.   ____________________________________________   FINAL CLINICAL IMPRESSION(S) / ED DIAGNOSES  Final diagnoses:  Acute maxillary sinusitis, recurrence not specified  Cough  Acute viral conjunctivitis of both eyes     ED Discharge Orders        Ordered    amoxicillin-clavulanate (AUGMENTIN) 875-125 MG tablet  Every 12 hours     02/25/17 0929    benzonatate (TESSALON PERLES) 100 MG capsule  3 times daily PRN     02/25/17 0929    chlorpheniramine-HYDROcodone (TUSSIONEX PENNKINETIC ER) 10-8 MG/5ML SUER  At bedtime PRN     02/25/17 0929    trimethoprim-polymyxin b (POLYTRIM) ophthalmic solution  Every 4 hours     02/25/17 0930       Note: This dictation was prepared with Dragon dictation along with smaller phrase technology. Any transcriptional errors that result from this process are unintentional.  Renford Dills, NP 02/25/17 1034    Renford Dills, NP 02/25/17 1034

## 2017-02-27 LAB — CULTURE, GROUP A STREP (THRC)

## 2017-05-06 ENCOUNTER — Ambulatory Visit
Admission: EM | Admit: 2017-05-06 | Discharge: 2017-05-06 | Disposition: A | Discharge status: Home / Self Care | Payer: Medicaid Other | Attending: Family Medicine | Admitting: Family Medicine

## 2017-05-06 DIAGNOSIS — R509 Fever, unspecified: Secondary | ICD-10-CM

## 2017-05-06 DIAGNOSIS — R5383 Other fatigue: Secondary | ICD-10-CM

## 2017-05-06 DIAGNOSIS — R05 Cough: Secondary | ICD-10-CM

## 2017-05-06 DIAGNOSIS — R6889 Other general symptoms and signs: Secondary | ICD-10-CM

## 2017-05-06 MED ORDER — OSELTAMIVIR PHOSPHATE 75 MG PO CAPS: 75.0000 mg | ORAL_CAPSULE | Freq: Two times a day (BID) | ORAL | 0 refills | Status: DC

## 2017-05-06 MED ORDER — BENZONATATE 200 MG PO CAPS: ORAL_CAPSULE | ORAL | 0 refills | Status: DC

## 2017-05-06 NOTE — ED Triage Notes (Signed)
Since 2 days ago, feeling weak, dry cough, fatigue and states she did have a Fever last night of 101. Was exposed to a couple people that did have the flu. Took tylenol cold and flu.

## 2017-05-06 NOTE — ED Provider Notes (Signed)
[facility]    CSN: [id number] Arrival date & time: 05/06/17  1710     History   Chief Complaint Chief Complaint  Patient presents with  . Influenza    HPI Laura [name] is a 27 y.o. female.   HPI  27 year old female presents with 2-day history of weakness dry cough fatigue fever up to 101 last night.  Today temperature is 98.8 but after she taken Tylenol Cold and flu at 4:00 pm.  Not receive her flu shot this year.  She has been around several contacts which were diagnosed with the flu.  Works has a Building surveyor.  Several of the children have also been ill.  He has had body aches along with her respiratory symptoms.         Past Medical History:  Diagnosis Date  . Gestational diabetes     Patient Active Problem List   Diagnosis Date Noted  . Gestational diabetes mellitus (GDM), delivered 05/19/2016  . Postpartum care following vaginal delivery 05/18/2016  . BMI 36.0-36.9,adult 05/17/2016  . Indication for care in labor and delivery, antepartum 03/24/2016    Past Surgical History:  Procedure Laterality Date  . NO PAST SURGERIES      OB History    Gravida Para Term Preterm AB Living   2 2 2     1    SAB TAB Ectopic Multiple Live Births         0 1       Home Medications    Prior to Admission medications   Medication Sig Start Date End Date Taking? Authorizing Provider  amoxicillin-clavulanate (AUGMENTIN) 875-125 MG tablet Take 1 tablet by mouth every 12 (twelve) hours. 02/25/17   Renford Dills, NP  benzonatate (TESSALON) 200 MG capsule Take one cap TID PRN cough 05/06/17   Lutricia Feil, PA-C  chlorpheniramine-HYDROcodone [facility] ER) 10-8 MG/5ML SUER Take 5 mLs by mouth at bedtime as needed for cough. do not drive or operate machinery while taking as can cause drowsiness. 02/25/17   Renford Dills, NP  ibuprofen (ADVIL,MOTRIN) 600 MG tablet Take 1 tablet (600 mg total) by mouth every 6 (six) hours as needed. 05/19/16    Farrel Conners, CNM  oseltamivir (TAMIFLU) 75 MG capsule Take 1 capsule (75 mg total) by mouth every 12 (twelve) hours. 05/06/17   Lutricia Feil, PA-C  trimethoprim-polymyxin b (POLYTRIM) ophthalmic solution Place 1 drop into both eyes every 4 (four) hours. For 7 days 02/25/17   Renford Dills, NP    Family History Family History  Problem Relation Age of Onset  . Cancer Paternal Grandmother   . Cancer Maternal Grandmother     Social History Social History   Tobacco Use  . Smoking status: Current Every Day Smoker    Packs/day: 0.50    Types: Cigarettes  . Smokeless tobacco: Former Neurosurgeon    Quit date: 04/21/2012  Substance Use Topics  . Alcohol use: No  . Drug use: No     Allergies   Patient has no known allergies.   Review of Systems Review of Systems  Constitutional: Positive for activity change, chills, fatigue and fever.  HENT: Positive for congestion and postnasal drip.   Respiratory: Positive for cough.   All other systems reviewed and are negative.    Physical Exam Triage Vital Signs ED Triage Vitals  Enc Vitals Group     BP 05/06/17 1720 130/82     Pulse Rate 05/06/17 1720 (!) 104  Resp 05/06/17 1720 18     Temp 05/06/17 1720 98.8 F (37.1 C)     Temp Source 05/06/17 1720 Oral     SpO2 05/06/17 1720 98 %     Weight 05/06/17 1722 185 lb (83.9 kg)     Height --      Head Circumference --      Peak Flow --      Pain Score 05/06/17 1722 0     Pain Loc --      Pain Edu? --      Excl. in GC? --    No data found.  Updated Vital Signs BP 130/82 (BP Location: Left Arm)   Pulse (!) 104   Temp 98.8 F (37.1 C) (Oral)   Resp 18   Wt 185 lb (83.9 kg)   SpO2 98%   Breastfeeding? No   BMI 34.96 kg/m   Visual Acuity Right Eye Distance:   Left Eye Distance:   Bilateral Distance:    Right Eye Near:   Left Eye Near:    Bilateral Near:     Physical Exam  Constitutional: She is oriented to person, place, and time. She appears well-developed  and well-nourished. No distress.  HENT:  Head: Normocephalic.  Right Ear: External ear normal.  Left Ear: External ear normal.  Nose: Nose normal.  Mouth/Throat: Oropharynx is clear and moist. No oropharyngeal exudate.  Eyes: Pupils are equal, round, and reactive to light. Right eye exhibits no discharge. Left eye exhibits no discharge.  Neck: Normal range of motion.  Pulmonary/Chest: Effort normal and breath sounds normal.  Musculoskeletal: Normal range of motion.  Lymphadenopathy:    She has no cervical adenopathy.  Neurological: She is alert and oriented to person, place, and time.  Skin: Skin is warm and dry. She is not diaphoretic.  Psychiatric: She has a normal mood and affect. Her behavior is normal. Judgment and thought content normal.  Nursing note and vitals reviewed.    UC Treatments / Results  Labs (all labs ordered are listed, but only abnormal results are displayed) Labs Reviewed - No data to display  EKG  EKG Interpretation None       Radiology No results found.  Procedures Procedures (including critical care time)  Medications Ordered in UC Medications - No data to display   Initial Impression / Assessment and Plan / UC Course  I have reviewed the triage vital signs and the nursing notes.  Pertinent labs & imaging results that were available during my care of the patient were reviewed by me and considered in my medical decision making (see chart for details).     Plan: 1. Test/x-ray results and diagnosis reviewed with patient 2. rx as per orders; risks, benefits, potential side effects reviewed with patient 3. Recommend supportive treatment with fluids and rest.  She has opted to start on Tamiflu and is within the window of treatment.  Will provide her with Tessalon Perles for cough suppression and have recommended Delsym syrup at nighttime.  He worsens she is to go to the emergency room or return to our clinic for reevaluation. 4. F/u prn if  symptoms worsen or don't improve   Final Clinical Impressions(s) / UC Diagnoses   Final diagnoses:  Flu-like symptoms    ED Discharge Orders        Ordered    oseltamivir (TAMIFLU) 75 MG capsule  Every 12 hours     05/06/17 1818    benzonatate (TESSALON) 200 MG capsule  05/06/17 1818       Controlled Substance Prescriptions [place] Controlled Substance Registry consulted? Not Applicable   Lutricia Feil, PA-C 05/06/17 1914

## 2017-08-31 ENCOUNTER — Emergency Department: Payer: No Typology Code available for payment source

## 2017-08-31 ENCOUNTER — Other Ambulatory Visit: Payer: Self-pay

## 2017-08-31 ENCOUNTER — Emergency Department
Admission: EM | Admit: 2017-08-31 | Discharge: 2017-08-31 | Disposition: A | Discharge status: Home / Self Care | Payer: No Typology Code available for payment source | Attending: Emergency Medicine | Admitting: Emergency Medicine

## 2017-08-31 ENCOUNTER — Encounter: Payer: Self-pay | Admitting: Emergency Medicine

## 2017-08-31 DIAGNOSIS — F17210 Nicotine dependence, cigarettes, uncomplicated: Secondary | ICD-10-CM | POA: Diagnosis not present

## 2017-08-31 DIAGNOSIS — T2109XA Burn of unspecified degree of other site of trunk, initial encounter: Secondary | ICD-10-CM | POA: Insufficient documentation

## 2017-08-31 DIAGNOSIS — M7918 Myalgia, other site: Secondary | ICD-10-CM | POA: Diagnosis not present

## 2017-08-31 DIAGNOSIS — S199XXA Unspecified injury of neck, initial encounter: Secondary | ICD-10-CM | POA: Diagnosis present

## 2017-08-31 DIAGNOSIS — Y998 Other external cause status: Secondary | ICD-10-CM | POA: Insufficient documentation

## 2017-08-31 DIAGNOSIS — Y929 Unspecified place or not applicable: Secondary | ICD-10-CM | POA: Insufficient documentation

## 2017-08-31 DIAGNOSIS — X19XXXA Contact with other heat and hot substances, initial encounter: Secondary | ICD-10-CM | POA: Insufficient documentation

## 2017-08-31 DIAGNOSIS — S161XXA Strain of muscle, fascia and tendon at neck level, initial encounter: Secondary | ICD-10-CM

## 2017-08-31 DIAGNOSIS — T300 Burn of unspecified body region, unspecified degree: Secondary | ICD-10-CM

## 2017-08-31 DIAGNOSIS — Y9389 Activity, other specified: Secondary | ICD-10-CM | POA: Diagnosis not present

## 2017-08-31 MED ORDER — IBUPROFEN 400 MG PO TABS
600.0000 mg | ORAL_TABLET | Freq: Once | ORAL | Status: AC
  Administered 2017-08-31: 600 mg via ORAL
  Filled 2017-08-31: qty 2

## 2017-08-31 MED ORDER — IBUPROFEN 400 MG PO TABS
ORAL_TABLET | ORAL | Status: AC
  Filled 2017-08-31: qty 1

## 2017-08-31 MED ORDER — SILVER SULFADIAZINE 1 % EX CREA
TOPICAL_CREAM | Freq: Once | CUTANEOUS | Status: AC
  Administered 2017-08-31: 20:00:00 via TOPICAL
  Filled 2017-08-31: qty 85
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
## 2017-09-03 ENCOUNTER — Ambulatory Visit
Admission: EM | Admit: 2017-09-03 | Discharge: 2017-09-03 | Disposition: A | Discharge status: Home / Self Care | Payer: Medicaid Other | Attending: Registered Nurse | Admitting: Registered Nurse

## 2017-09-03 ENCOUNTER — Encounter: Payer: Self-pay | Admitting: Emergency Medicine

## 2017-09-03 ENCOUNTER — Other Ambulatory Visit: Payer: Self-pay

## 2017-09-03 DIAGNOSIS — S161XXD Strain of muscle, fascia and tendon at neck level, subsequent encounter: Secondary | ICD-10-CM

## 2017-09-03 DIAGNOSIS — S29012D Strain of muscle and tendon of back wall of thorax, subsequent encounter: Secondary | ICD-10-CM

## 2017-09-03 DIAGNOSIS — R202 Paresthesia of skin: Secondary | ICD-10-CM

## 2017-09-03 DIAGNOSIS — M62838 Other muscle spasm: Secondary | ICD-10-CM

## 2017-09-03 DIAGNOSIS — S29019D Strain of muscle and tendon of unspecified wall of thorax, subsequent encounter: Secondary | ICD-10-CM

## 2017-09-03 MED ORDER — MENTHOL (TOPICAL ANALGESIC) 16 % EX GEL: 1.0000 "application " | Freq: Two times a day (BID) | CUTANEOUS | 0 refills | Status: AC | PRN

## 2017-09-03 MED ORDER — IBUPROFEN 800 MG PO TABS: 800.0000 mg | ORAL_TABLET | Freq: Three times a day (TID) | ORAL | 0 refills | Status: AC | PRN

## 2017-09-03 MED ORDER — ACETAMINOPHEN 500 MG PO TABS: 1000.0000 mg | ORAL_TABLET | Freq: Four times a day (QID) | ORAL | 0 refills | Status: DC | PRN

## 2017-09-03 MED ORDER — CYCLOBENZAPRINE HCL 10 MG PO TABS: 10.0000 mg | ORAL_TABLET | Freq: Three times a day (TID) | ORAL | 0 refills | Status: AC | PRN

## 2017-09-03 NOTE — Discharge Instructions (Addendum)
Avoid alcohol intake and driving after taking flexeril it will cause drowsiness; may cut in half Ice or heat therapy 15 minutes four times per day Icy hot twice a day topical but not prior to applying heat Follow up re-evaluation if incontinence/arm/leg weakness or saddle paresthesias Gentle stretches/AROM three times per day
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
## 2017-09-17 ENCOUNTER — Encounter: Payer: Self-pay | Admitting: Emergency Medicine

## 2017-09-17 ENCOUNTER — Other Ambulatory Visit: Payer: Self-pay

## 2017-09-17 ENCOUNTER — Ambulatory Visit
Admission: EM | Admit: 2017-09-17 | Discharge: 2017-09-17 | Disposition: A | Discharge status: Home / Self Care | Payer: Medicaid Other | Attending: Family Medicine | Admitting: Family Medicine

## 2017-09-17 DIAGNOSIS — R51 Headache: Secondary | ICD-10-CM

## 2017-09-17 DIAGNOSIS — R200 Anesthesia of skin: Secondary | ICD-10-CM

## 2017-09-17 DIAGNOSIS — M546 Pain in thoracic spine: Secondary | ICD-10-CM

## 2017-09-17 MED ORDER — CYCLOBENZAPRINE HCL 10 MG PO TABS: 10.0000 mg | ORAL_TABLET | Freq: Three times a day (TID) | ORAL | 0 refills | Status: DC | PRN

## 2017-09-17 MED ORDER — HYDROCODONE-ACETAMINOPHEN 5-325 MG PO TABS: 1.0000 | ORAL_TABLET | Freq: Three times a day (TID) | ORAL | 0 refills | Status: DC | PRN
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
## 2017-09-17 NOTE — Discharge Instructions (Signed)
Referral is in place for [facility]. If you have not heard anything next week, call their office.  Pain medication as prescribed.  Rest.  Take care  Dr. Adriana Simasook

## 2017-09-20 ENCOUNTER — Encounter: Payer: Self-pay | Admitting: Neurology

## 2017-09-20 ENCOUNTER — Ambulatory Visit: Payer: Self-pay | Admitting: Neurology

## 2017-09-20 ENCOUNTER — Telehealth: Payer: Self-pay | Admitting: Neurology

## 2017-09-20 VITALS — BP 119/87 | HR 100 | Ht 61.0 in | Wt 197.0 lb

## 2017-09-20 DIAGNOSIS — G44309 Post-traumatic headache, unspecified, not intractable: Secondary | ICD-10-CM

## 2017-09-20 DIAGNOSIS — R200 Anesthesia of skin: Secondary | ICD-10-CM

## 2017-09-20 DIAGNOSIS — H02401 Unspecified ptosis of right eyelid: Secondary | ICD-10-CM

## 2017-09-20 DIAGNOSIS — R202 Paresthesia of skin: Secondary | ICD-10-CM

## 2017-09-20 DIAGNOSIS — R29810 Facial weakness: Secondary | ICD-10-CM

## 2017-09-20 HISTORY — DX: Post-traumatic headache, unspecified, not intractable: G44.309

## 2017-09-20 MED ORDER — PREDNISONE 20 MG PO TABS: ORAL_TABLET | ORAL | 1 refills | Status: DC

## 2017-09-20 NOTE — Telephone Encounter (Signed)
medicaid order sent to GI. They will reach out to the pt to schedule.

## 2017-09-20 NOTE — Telephone Encounter (Signed)
Pt forgot to get note for work when she left today that she was seen at clinic today. She is asking for it to mailed to her. No reason to call
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
## 2017-09-20 NOTE — Patient Instructions (Addendum)
MRI brain w/wo contrast   Prednisone tablets What is this medicine? PREDNISONE (PRED ni sone) is a corticosteroid. It is commonly used to treat inflammation of the skin, joints, lungs, and other organs. Common conditions treated include asthma, allergies, and arthritis. It is also used for other conditions, such as blood disorders and diseases of the adrenal glands. This medicine may be used for other purposes; ask your health care provider or pharmacist if you have questions. COMMON BRAND NAME(S): Deltasone, Predone, Sterapred, Sterapred DS What should I tell my health care provider before I take this medicine? They need to know if you have any of these conditions: -Cushing's syndrome -diabetes -glaucoma -heart disease -high blood pressure -infection (especially a virus infection such as chickenpox, cold sores, or herpes) -kidney disease -liver disease -mental illness -myasthenia gravis -osteoporosis -seizures -stomach or intestine problems -thyroid disease -an unusual or allergic reaction to lactose, prednisone, other medicines, foods, dyes, or preservatives -pregnant or trying to get pregnant -breast-feeding How should I use this medicine? Take this medicine by mouth with a glass of water. Follow the directions on the prescription label. Take this medicine with food. If you are taking this medicine once a day, take it in the morning. Do not take more medicine than you are told to take. Do not suddenly stop taking your medicine because you may develop a severe reaction. Your doctor will tell you how much medicine to take. If your doctor wants you to stop the medicine, the dose may be slowly lowered over time to avoid any side effects. Talk to your pediatrician regarding the use of this medicine in children. Special care may be needed. Overdosage: If you think you have taken too much of this medicine contact a poison control center or emergency room at once. NOTE: This medicine is  only for you. Do not share this medicine with others. What if I miss a dose? If you miss a dose, take it as soon as you can. If it is almost time for your next dose, talk to your doctor or health care professional. You may need to miss a dose or take an extra dose. Do not take double or extra doses without advice. What may interact with this medicine? Do not take this medicine with any of the following medications: -metyrapone -mifepristone This medicine may also interact with the following medications: -aminoglutethimide -amphotericin B -aspirin and aspirin-like medicines -barbiturates -certain medicines for diabetes, like glipizide or glyburide -cholestyramine -cholinesterase inhibitors -cyclosporine -digoxin -diuretics -ephedrine -female hormones, like estrogens and birth control pills -isoniazid -ketoconazole -NSAIDS, medicines for pain and inflammation, like ibuprofen or naproxen -phenytoin -rifampin -toxoids -vaccines -warfarin This list may not describe all possible interactions. Give your health care provider a list of all the medicines, herbs, non-prescription drugs, or dietary supplements you use. Also tell them if you smoke, drink alcohol, or use illegal drugs. Some items may interact with your medicine. What should I watch for while using this medicine? Visit your doctor or health care professional for regular checks on your progress. If you are taking this medicine over a prolonged period, carry an identification card with your name and address, the type and dose of your medicine, and your doctor's name and address. This medicine may increase your risk of getting an infection. Tell your doctor or health care professional if you are around anyone with measles or chickenpox, or if you develop sores or blisters that do not heal properly. If you are going to have surgery, tell your  doctor or health care professional that you have taken this medicine within the last twelve  months. Ask your doctor or health care professional about your diet. You may need to lower the amount of salt you eat. This medicine may affect blood sugar levels. If you have diabetes, check with your doctor or health care professional before you change your diet or the dose of your diabetic medicine. What side effects may I notice from receiving this medicine? Side effects that you should report to your doctor or health care professional as soon as possible: -allergic reactions like skin rash, itching or hives, swelling of the face, lips, or tongue -changes in emotions or moods -changes in vision -depressed mood -eye pain -fever or chills, cough, sore throat, pain or difficulty passing urine -increased thirst -swelling of ankles, feet Side effects that usually do not require medical attention (report to your doctor or health care professional if they continue or are bothersome): -confusion, excitement, restlessness -headache -nausea, vomiting -skin problems, acne, thin and shiny skin -trouble sleeping -weight gain This list may not describe all possible side effects. Call your doctor for medical advice about side effects. You may report side effects to FDA at 1-800-FDA-1088. Where should I keep my medicine? Keep out of the reach of children. Store at room temperature between 15 and 30 degrees C (59 and 86 degrees F). Protect from light. Keep container tightly closed. Throw away any unused medicine after the expiration date. NOTE: This sheet is a summary. It may not cover all possible information. If you have questions about this medicine, talk to your doctor, pharmacist, or health care provider.  2018 Elsevier/Gold Standard (2010-10-23 10:57:14)  Post-Concussion Syndrome Post-concussion syndrome is the symptoms that can occur after a head injury. These symptoms can last from weeks to months. Follow these instructions at home:  Take medicines only as told by your doctor.  Do not take  aspirin.  Sleep with your head raised to help with headaches.  Avoid activities that can cause another head injury. ? Do not play contact sports like football, hockey, soccer, or basketball. ? Do not do other risky activities like downhill skiing, martial arts, or horseback riding until your doctor says it is okay.  Keep all follow-up visits as told by your doctor. This is important. Contact a doctor if:  You have a harder time: ? Paying attention. ? Focusing. ? Remembering. ? Learning new information. ? Dealing with stress.  You need more time to complete tasks.  You are easily bothered (irritable).  You have more symptoms. Get help if you have any of these symptoms for more than two weeks after your injury:  Long-lasting (chronic) headaches.  Dizziness.  Trouble balancing.  Feeling sick to your stomach (nauseous).  Trouble with your vision.  Noise or light bothers you more.  Depression.  Mood swings.  Feeling worried (anxious).  Easily bothered.  Memory problems.  Trouble concentrating or paying attention.  Sleep problems.  Feeling tired all of the time.  Get help right away if:  You feel confused.  You feel very sleepy.  You are hard to wake up.  You feel sick to your stomach.  You keep throwing up (vomiting).  You feel like you are moving when you are not (vertigo).  Your eyes move back and forth very quickly.  You start shaking (convulsing) or pass out (faint).  You have very bad headaches that do not get better with medicine.  You cannot use your arms or  legs like normal.  One of the black centers of your eyes (pupils) is bigger than the other.  You have clear or bloody fluid coming from your nose or ears.  Your problems get worse, not better. This information is not intended to replace advice given to you by your health care provider. Make sure you discuss any questions you have with your health care provider. Document Released:  04/16/2004 Document Revised: 08/15/2015 Document Reviewed: 06/14/2013 Elsevier Interactive Patient Education  2018 [employer].

## 2017-09-20 NOTE — Telephone Encounter (Signed)
Noted mailed to home address/fim

## 2017-09-20 NOTE — Telephone Encounter (Signed)
LMTC. I need more information--is she asking for a work note for today only?/fim

## 2017-09-22 ENCOUNTER — Encounter: Payer: Self-pay | Admitting: Neurology

## 2017-09-25 ENCOUNTER — Ambulatory Visit
Admission: RE | Admit: 2017-09-25 | Discharge: 2017-09-25 | Disposition: A | Discharge status: Home / Self Care | Payer: Self-pay | Source: Ambulatory Visit | Attending: Neurology | Admitting: Neurology

## 2017-09-25 DIAGNOSIS — R202 Paresthesia of skin: Secondary | ICD-10-CM

## 2017-09-25 DIAGNOSIS — R200 Anesthesia of skin: Secondary | ICD-10-CM

## 2017-09-25 DIAGNOSIS — R29810 Facial weakness: Secondary | ICD-10-CM

## 2017-09-25 DIAGNOSIS — H02401 Unspecified ptosis of right eyelid: Secondary | ICD-10-CM

## 2017-09-25 MED ORDER — GADOBENATE DIMEGLUMINE 529 MG/ML IV SOLN
19.0000 mL | Freq: Once | INTRAVENOUS | Status: AC | PRN
  Administered 2017-09-25: 19 mL via INTRAVENOUS

## 2017-09-27 NOTE — Telephone Encounter (Signed)
This encounter was created in error - please disregard.

## 2017-09-28 ENCOUNTER — Telehealth: Payer: Self-pay | Admitting: Neurology

## 2017-09-28 ENCOUNTER — Other Ambulatory Visit: Payer: Self-pay | Admitting: Neurology

## 2017-09-28 DIAGNOSIS — H5462 Unqualified visual loss, left eye, normal vision right eye: Secondary | ICD-10-CM

## 2017-09-28 NOTE — Telephone Encounter (Signed)
Called the patient to make her aware of the normal MRI results. No answer. LVM for the pt to call back.   When patient calls back please inform her the MRI of the brain was normal and Dr Lucia GaskinsAhern didn't see anything of any concern.

## 2017-09-28 NOTE — Telephone Encounter (Signed)
-----   Message from Anson FretAntonia B Ahern, MD sent at 09/27/2017 12:27 PM EDT ----- MRI brain normal

## 2017-09-28 NOTE — Telephone Encounter (Signed)
Advised patient of normal MRI of the brain per previous message.

## 2018-01-03 ENCOUNTER — Other Ambulatory Visit: Payer: Self-pay

## 2018-01-03 ENCOUNTER — Encounter: Payer: Self-pay | Admitting: Emergency Medicine

## 2018-01-03 ENCOUNTER — Ambulatory Visit
Admission: EM | Admit: 2018-01-03 | Discharge: 2018-01-03 | Disposition: A | Discharge status: Home / Self Care | Payer: Medicaid Other | Attending: Family Medicine | Admitting: Family Medicine

## 2018-01-03 DIAGNOSIS — J020 Streptococcal pharyngitis: Secondary | ICD-10-CM | POA: Insufficient documentation

## 2018-01-03 DIAGNOSIS — R05 Cough: Secondary | ICD-10-CM

## 2018-01-03 DIAGNOSIS — Z79899 Other long term (current) drug therapy: Secondary | ICD-10-CM | POA: Insufficient documentation

## 2018-01-03 DIAGNOSIS — R059 Cough, unspecified: Secondary | ICD-10-CM

## 2018-01-03 DIAGNOSIS — F17210 Nicotine dependence, cigarettes, uncomplicated: Secondary | ICD-10-CM | POA: Insufficient documentation

## 2018-01-03 DIAGNOSIS — Z975 Presence of (intrauterine) contraceptive device: Secondary | ICD-10-CM | POA: Insufficient documentation

## 2018-01-03 LAB — RAPID STREP SCREEN (MED CTR MEBANE ONLY): Streptococcus, Group A Screen (Direct): POSITIVE — AB

## 2018-01-03 MED ORDER — PENICILLIN V POTASSIUM 500 MG PO TABS: 500.0000 mg | ORAL_TABLET | Freq: Three times a day (TID) | ORAL | 0 refills | Status: DC

## 2018-01-03 NOTE — ED Triage Notes (Signed)
Patient c/o cough and chest congestion and sinus congestion and runny nose for the past 2 weeks.  Patient reports scratchy throat that started 2 days ago.  Patient denies fevers.
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
## 2018-01-24 IMAGING — CR DG TOE GREAT 2+V*R*
3 series · 3 of 3 positions shown · non-contrast
Comparison: None.

CLINICAL DATA: 25-year-old female status post fall down steps today
with great toe pain.

EXAM:
RIGHT GREAT TOE

[toe ap]
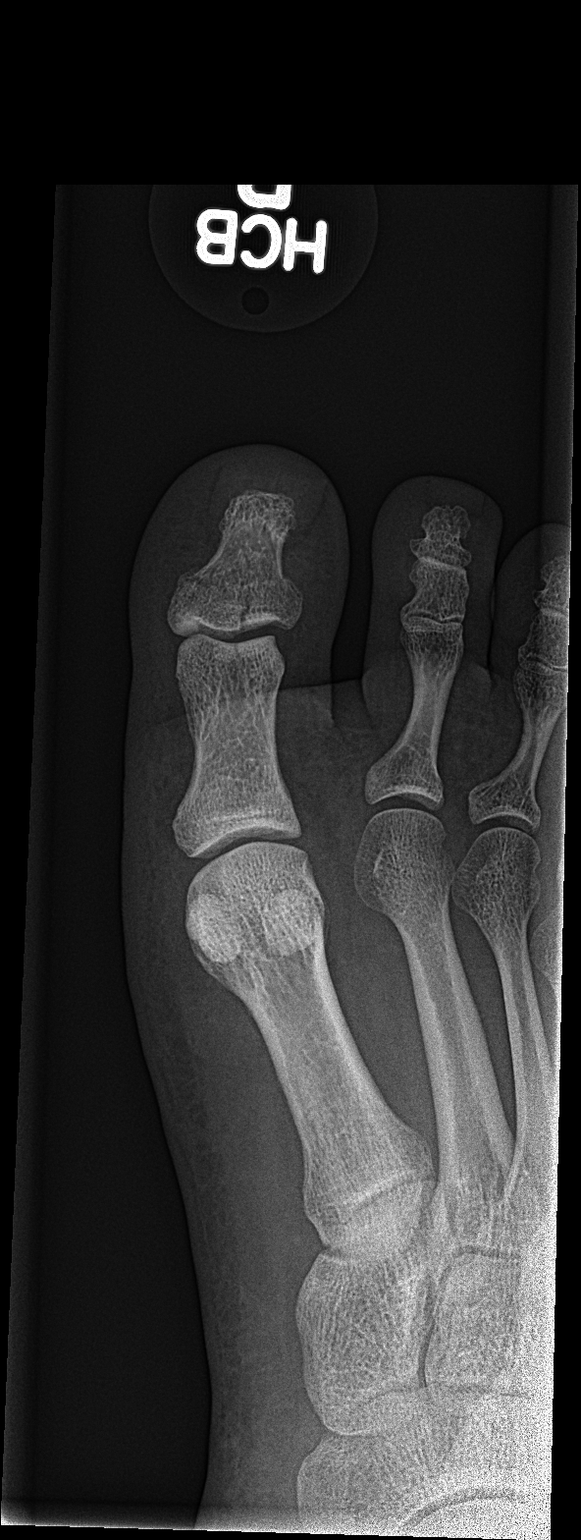

[toe obl]
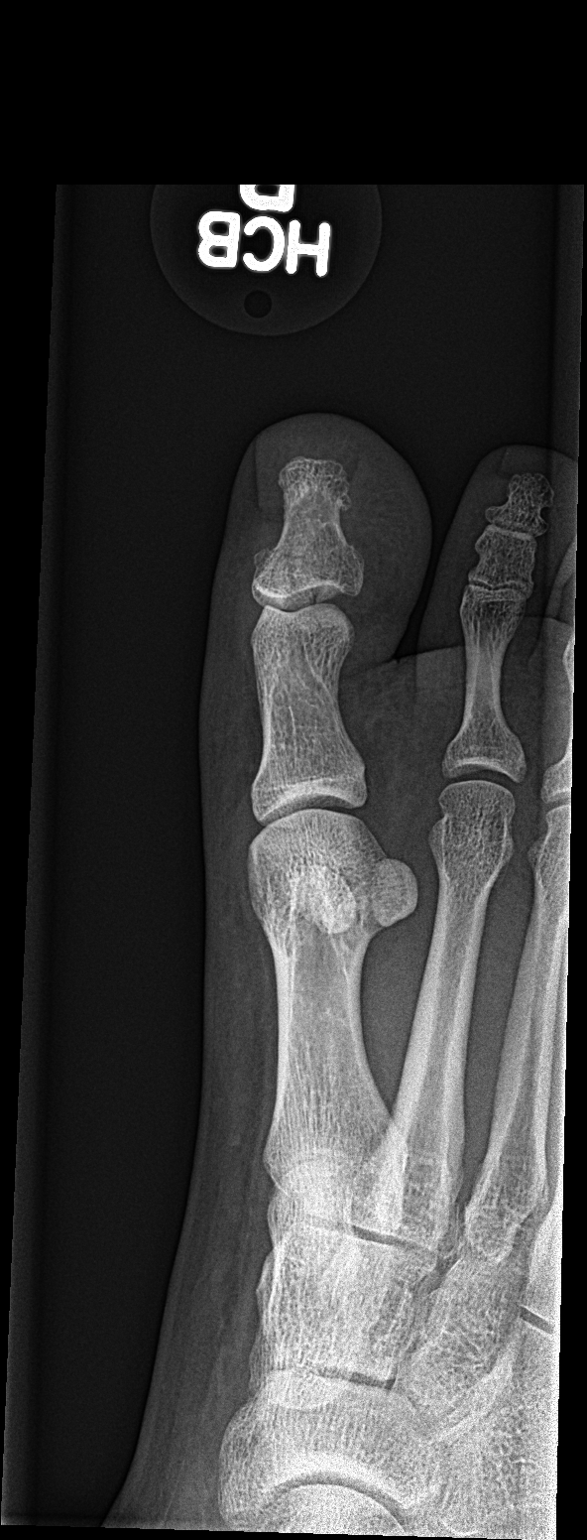

[toe lat]
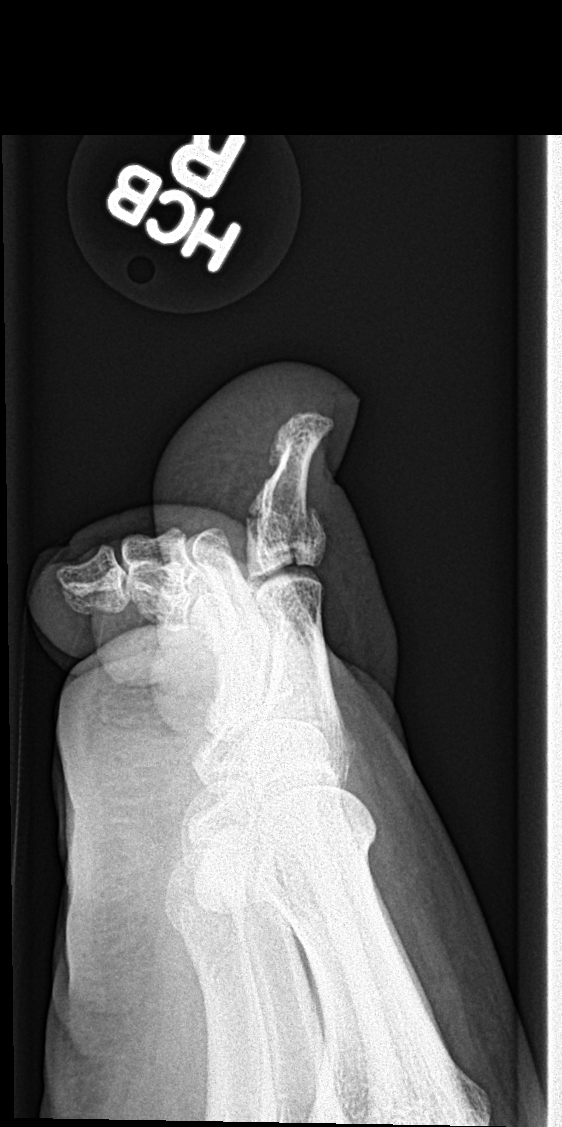

[3 of 3 positions shown; findings below may reference images not displayed]

FINDINGS: Comminuted intra-articular fracture of the right great toe distal
phalanx with minimal displacement. First IP joint involvement. Soft
tissue swelling. The right first proximal phalanx and metatarsal
appear intact. Other visible osseous structures appear intact.
IMPRESSION: Comminuted and intra-articular but minimally displaced fracture of
the right great toe distal phalanx.

## 2018-04-15 ENCOUNTER — Other Ambulatory Visit: Payer: Self-pay

## 2018-04-15 ENCOUNTER — Ambulatory Visit
Admission: EM | Admit: 2018-04-15 | Discharge: 2018-04-15 | Disposition: A | Discharge status: Home / Self Care | Payer: Medicaid Other | Attending: Family Medicine | Admitting: Family Medicine

## 2018-04-15 ENCOUNTER — Encounter: Payer: Self-pay | Admitting: Emergency Medicine

## 2018-04-15 DIAGNOSIS — J020 Streptococcal pharyngitis: Secondary | ICD-10-CM

## 2018-04-15 DIAGNOSIS — F17210 Nicotine dependence, cigarettes, uncomplicated: Secondary | ICD-10-CM

## 2018-04-15 DIAGNOSIS — J0300 Acute streptococcal tonsillitis, unspecified: Secondary | ICD-10-CM

## 2018-04-15 HISTORY — DX: Acute streptococcal tonsillitis, unspecified: J03.00

## 2018-04-15 HISTORY — DX: Streptococcal pharyngitis: J02.0

## 2018-04-15 LAB — RAPID INFLUENZA A&B ANTIGENS (ARMC ONLY)
INFLUENZA A (ARMC): NEGATIVE
INFLUENZA B (ARMC): NEGATIVE

## 2018-04-15 LAB — RAPID STREP SCREEN (MED CTR MEBANE ONLY): STREPTOCOCCUS, GROUP A SCREEN (DIRECT): POSITIVE — AB

## 2018-04-15 MED ORDER — AMOXICILLIN 500 MG PO CAPS: 500.0000 mg | ORAL_CAPSULE | Freq: Three times a day (TID) | ORAL | 0 refills | Status: AC

## 2018-04-15 MED ORDER — KETOROLAC TROMETHAMINE 60 MG/2ML IM SOLN
60.0000 mg | Freq: Once | INTRAMUSCULAR | Status: AC
  Administered 2018-04-15: 60 mg via INTRAMUSCULAR

## 2018-04-15 MED ORDER — DEXAMETHASONE SODIUM PHOSPHATE 10 MG/ML IJ SOLN
10.0000 mg | Freq: Once | INTRAMUSCULAR | Status: AC
  Administered 2018-04-15: 10 mg via INTRAMUSCULAR
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
## 2018-04-15 NOTE — ED Triage Notes (Signed)
Pt c/o sore throat, cough, fever, and chills. Started about 3 days ago. Son recently diagnosed with strep and Flu.

## 2018-04-15 NOTE — Discharge Instructions (Addendum)
Rest,push fluids, take abx as directed until completed. Do not share meds or eat or drink after anyone. Follow up with PCP as needed.

## 2018-05-15 ENCOUNTER — Other Ambulatory Visit: Payer: Self-pay

## 2018-05-15 ENCOUNTER — Ambulatory Visit
Admission: EM | Admit: 2018-05-15 | Discharge: 2018-05-15 | Disposition: A | Discharge status: Home / Self Care | Payer: Medicaid Other | Attending: Family Medicine | Admitting: Family Medicine

## 2018-05-15 DIAGNOSIS — R509 Fever, unspecified: Secondary | ICD-10-CM

## 2018-05-15 DIAGNOSIS — R05 Cough: Secondary | ICD-10-CM

## 2018-05-15 DIAGNOSIS — Z720 Tobacco use: Secondary | ICD-10-CM

## 2018-05-15 DIAGNOSIS — J111 Influenza due to unidentified influenza virus with other respiratory manifestations: Secondary | ICD-10-CM

## 2018-05-15 MED ORDER — OSELTAMIVIR PHOSPHATE 75 MG PO CAPS: 75.0000 mg | ORAL_CAPSULE | Freq: Two times a day (BID) | ORAL | 0 refills | Status: DC

## 2018-05-15 MED ORDER — HYDROCODONE-HOMATROPINE 5-1.5 MG/5ML PO SYRP: 5.0000 mL | ORAL_SOLUTION | Freq: Four times a day (QID) | ORAL | 0 refills | Status: DC | PRN

## 2018-05-15 NOTE — ED Triage Notes (Signed)
Started about a week ago. Endorses fever, chills, body aches and general malaise. Chest burns when she coughs. States she has not had anything to eat since Friday because it makes her nauseated. 

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
## 2018-09-06 ENCOUNTER — Encounter: Payer: Self-pay | Admitting: Maternal Newborn

## 2018-09-06 ENCOUNTER — Ambulatory Visit (INDEPENDENT_AMBULATORY_CARE_PROVIDER_SITE_OTHER): Payer: Medicaid Other | Admitting: Maternal Newborn

## 2018-09-06 ENCOUNTER — Other Ambulatory Visit: Payer: Self-pay

## 2018-09-06 VITALS — BP 130/80 | Ht 61.0 in | Wt 203.0 lb

## 2018-09-06 DIAGNOSIS — Z975 Presence of (intrauterine) contraceptive device: Secondary | ICD-10-CM

## 2018-09-06 DIAGNOSIS — Z3201 Encounter for pregnancy test, result positive: Secondary | ICD-10-CM

## 2018-09-06 DIAGNOSIS — N921 Excessive and frequent menstruation with irregular cycle: Secondary | ICD-10-CM
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
## 2018-09-07 LAB — BETA HCG QUANT (REF LAB): hCG Quant: <1 m[IU]/mL

## 2019-09-14 ENCOUNTER — Ambulatory Visit
Admission: EM | Admit: 2019-09-14 | Discharge: 2019-09-14 | Disposition: A | Discharge status: Home / Self Care | Payer: 59 | Attending: Family Medicine | Admitting: Family Medicine

## 2019-09-14 ENCOUNTER — Other Ambulatory Visit: Payer: Self-pay

## 2019-09-14 DIAGNOSIS — J069 Acute upper respiratory infection, unspecified: Secondary | ICD-10-CM | POA: Diagnosis not present

## 2019-09-14 LAB — GROUP A STREP BY PCR: Group A Strep by PCR: NOT DETECTED

## 2019-09-14 MED ORDER — BENZONATATE 200 MG PO CAPS: 200.0000 mg | ORAL_CAPSULE | Freq: Three times a day (TID) | ORAL | 0 refills | Status: DC | PRN

## 2019-09-14 MED ORDER — PREDNISONE 50 MG PO TABS: ORAL_TABLET | ORAL | 0 refills | Status: DC

## 2019-09-14 NOTE — ED Triage Notes (Signed)
Patient complains of sore throat, coughing, body aches, fatigue that started Monday. 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 Patient states that cough is worse at night.
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
## 2021-02-21 ENCOUNTER — Other Ambulatory Visit: Payer: Self-pay

## 2021-02-21 ENCOUNTER — Ambulatory Visit (INDEPENDENT_AMBULATORY_CARE_PROVIDER_SITE_OTHER): Payer: 59

## 2021-02-21 ENCOUNTER — Ambulatory Visit
Admission: RE | Admit: 2021-02-21 | Discharge: 2021-02-21 | Disposition: A | Discharge status: Home / Self Care | Payer: 59 | Source: Ambulatory Visit | Attending: Emergency Medicine | Admitting: Emergency Medicine

## 2021-02-21 VITALS — BP 123/90 | HR 93 | Temp 99.0°F | Resp 18 | Ht 61.0 in | Wt 185.0 lb

## 2021-02-21 DIAGNOSIS — R509 Fever, unspecified: Secondary | ICD-10-CM

## 2021-02-21 DIAGNOSIS — J111 Influenza due to unidentified influenza virus with other respiratory manifestations: Secondary | ICD-10-CM

## 2021-02-21 DIAGNOSIS — R059 Cough, unspecified: Secondary | ICD-10-CM

## 2021-02-21 DIAGNOSIS — H6502 Acute serous otitis media, left ear: Secondary | ICD-10-CM

## 2021-02-21 DIAGNOSIS — R051 Acute cough: Secondary | ICD-10-CM

## 2021-02-21 MED ORDER — PREDNISONE 10 MG (21) PO TBPK: ORAL_TABLET | Freq: Every day | ORAL | 0 refills | Status: DC

## 2021-02-21 MED ORDER — AMOXICILLIN-POT CLAVULANATE 875-125 MG PO TABS: 1.0000 | ORAL_TABLET | Freq: Two times a day (BID) | ORAL | 0 refills | Status: DC

## 2021-02-21 MED ORDER — ALBUTEROL SULFATE HFA 108 (90 BASE) MCG/ACT IN AERS: 1.0000 | INHALATION_SPRAY | Freq: Four times a day (QID) | RESPIRATORY_TRACT | 0 refills | Status: DC | PRN

## 2021-02-21 MED ORDER — BENZONATATE 200 MG PO CAPS: 200.0000 mg | ORAL_CAPSULE | Freq: Three times a day (TID) | ORAL | 0 refills | Status: DC | PRN

## 2021-02-21 NOTE — Discharge Instructions (Addendum)
Use NSAIDS as needed for pain or inflammation  Use inhaler as needed for cough    Use humidifier as needed Stay hydrated well Cont to take otc cough and cold meds as needed
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
## 2021-02-21 NOTE — ED Triage Notes (Signed)
Pt here with C/O Cough for 3 weeks. Left ear clogged, throat pain since Tuesday.

## 2021-05-09 ENCOUNTER — Encounter: Payer: Self-pay | Admitting: Emergency Medicine

## 2021-05-09 ENCOUNTER — Ambulatory Visit
Admission: EM | Admit: 2021-05-09 | Discharge: 2021-05-09 | Disposition: A | Discharge status: Home / Self Care | Payer: 59 | Attending: Internal Medicine | Admitting: Internal Medicine

## 2021-05-09 ENCOUNTER — Other Ambulatory Visit: Payer: Self-pay

## 2021-05-09 DIAGNOSIS — J020 Streptococcal pharyngitis: Secondary | ICD-10-CM | POA: Diagnosis not present

## 2021-05-09 LAB — GROUP A STREP BY PCR: Group A Strep by PCR: DETECTED — AB

## 2021-05-09 MED ORDER — BENZONATATE 200 MG PO CAPS: 200.0000 mg | ORAL_CAPSULE | Freq: Three times a day (TID) | ORAL | 0 refills | Status: DC | PRN

## 2021-05-09 MED ORDER — IBUPROFEN 600 MG PO TABS: 600.0000 mg | ORAL_TABLET | Freq: Four times a day (QID) | ORAL | 0 refills | Status: DC | PRN

## 2021-05-09 MED ORDER — AMOXICILLIN 500 MG PO CAPS: 500.0000 mg | ORAL_CAPSULE | Freq: Two times a day (BID) | ORAL | 0 refills | Status: AC

## 2021-05-09 NOTE — ED Triage Notes (Signed)
Pt c/o sore throat, fever (101). Started yesterday.
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
## 2021-05-09 NOTE — Discharge Instructions (Addendum)
Please increase oral fluid intake Chloraseptic throat spray will help with throat pain Take antibiotics as prescribed Strep test was positive. If symptoms worsen please return to urgent care to be reevaluated Maintain adequate hydration.
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
## 2022-01-06 ENCOUNTER — Ambulatory Visit
Admission: EM | Admit: 2022-01-06 | Discharge: 2022-01-06 | Disposition: A | Discharge status: Home / Self Care | Payer: No Typology Code available for payment source | Attending: Internal Medicine | Admitting: Internal Medicine

## 2022-01-06 DIAGNOSIS — S0003XA Contusion of scalp, initial encounter: Secondary | ICD-10-CM | POA: Diagnosis not present

## 2022-01-06 MED ORDER — ONDANSETRON 4 MG PO TBDP: 4.0000 mg | ORAL_TABLET | Freq: Three times a day (TID) | ORAL | 0 refills | Status: DC | PRN

## 2022-01-06 MED ORDER — ONDANSETRON 4 MG PO TBDP
4.0000 mg | ORAL_TABLET | Freq: Once | ORAL | Status: AC
  Administered 2022-01-06: 4 mg via ORAL

## 2022-01-06 MED ORDER — ONDANSETRON 8 MG PO TBDP: 8.0000 mg | ORAL_TABLET | Freq: Once | ORAL | Status: DC

## 2022-01-06 MED ORDER — IBUPROFEN 600 MG PO TABS: 600.0000 mg | ORAL_TABLET | Freq: Four times a day (QID) | ORAL | 0 refills | Status: DC | PRN

## 2022-01-06 NOTE — Discharge Instructions (Addendum)
Please take medications as prescribed Icing of the scalp will help with bruising If you experience persistent nausea, blurry vision, light sensitivity, confusion or persistent vomiting please return to urgent care to be reevaluated.
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
## 2022-04-07 ENCOUNTER — Ambulatory Visit
Admission: EM | Admit: 2022-04-07 | Discharge: 2022-04-07 | Disposition: A | Discharge status: Home / Self Care | Payer: 59 | Attending: Physician Assistant | Admitting: Physician Assistant

## 2022-04-07 ENCOUNTER — Encounter: Payer: Self-pay | Admitting: Emergency Medicine

## 2022-04-07 DIAGNOSIS — R051 Acute cough: Secondary | ICD-10-CM | POA: Diagnosis not present

## 2022-04-07 DIAGNOSIS — R0981 Nasal congestion: Secondary | ICD-10-CM

## 2022-04-07 DIAGNOSIS — B349 Viral infection, unspecified: Secondary | ICD-10-CM

## 2022-04-07 MED ORDER — IPRATROPIUM BROMIDE 0.06 % NA SOLN: 2.0000 | Freq: Four times a day (QID) | NASAL | 0 refills | Status: DC

## 2022-04-07 MED ORDER — CHERATUSSIN AC 100-10 MG/5ML PO SOLN: ORAL | 0 refills | Status: DC

## 2022-04-07 NOTE — ED Triage Notes (Signed)
Pt c/o cough, runny nose, nasal congestion. She states it started about a week ago with flu like symptoms.
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
## 2022-04-07 NOTE — Discharge Instructions (Signed)
URI/COLD SYMPTOMS: Your exam today is consistent with a viral illness. Antibiotics are not indicated at this time. Use medications as directed, including cough syrup, nasal saline, and decongestants. Your symptoms should improve over the next few days and resolve within 7-10 days. Increase rest and fluids. F/u if symptoms worsen or predominate such as sore throat, ear pain, productive cough, shortness of breath, or if you develop high fevers or worsening fatigue over the next several days.    

## 2022-04-14 ENCOUNTER — Ambulatory Visit (INDEPENDENT_AMBULATORY_CARE_PROVIDER_SITE_OTHER): Payer: 59

## 2022-04-14 ENCOUNTER — Ambulatory Visit
Admission: EM | Admit: 2022-04-14 | Discharge: 2022-04-14 | Disposition: A | Discharge status: Home / Self Care | Payer: 59 | Attending: Emergency Medicine | Admitting: Emergency Medicine

## 2022-04-14 DIAGNOSIS — R059 Cough, unspecified: Secondary | ICD-10-CM

## 2022-04-14 DIAGNOSIS — R051 Acute cough: Secondary | ICD-10-CM

## 2022-04-14 DIAGNOSIS — J0140 Acute pansinusitis, unspecified: Secondary | ICD-10-CM | POA: Diagnosis not present

## 2022-04-14 MED ORDER — AEROCHAMBER MV MISC: 1 refills | Status: DC

## 2022-04-14 MED ORDER — ALBUTEROL SULFATE HFA 108 (90 BASE) MCG/ACT IN AERS: 1.0000 | INHALATION_SPRAY | RESPIRATORY_TRACT | 0 refills | Status: DC | PRN

## 2022-04-14 MED ORDER — IPRATROPIUM BROMIDE 0.06 % NA SOLN: 2.0000 | Freq: Four times a day (QID) | NASAL | 0 refills | Status: DC

## 2022-04-14 MED ORDER — PREDNISONE 20 MG PO TABS: 40.0000 mg | ORAL_TABLET | Freq: Every day | ORAL | 0 refills | Status: AC

## 2022-04-14 MED ORDER — AMOXICILLIN-POT CLAVULANATE 875-125 MG PO TABS: 1.0000 | ORAL_TABLET | Freq: Two times a day (BID) | ORAL | 0 refills | Status: DC

## 2022-04-14 MED ORDER — PROMETHAZINE-DM 6.25-15 MG/5ML PO SYRP: 5.0000 mL | ORAL_SOLUTION | Freq: Four times a day (QID) | ORAL | 0 refills | Status: DC | PRN
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
## 2022-04-14 NOTE — Discharge Instructions (Addendum)
I did not see a pneumonia on your x-ray, however, the radiology read is still pending.  We will contact you if they see a pneumonia.  I am going to treat you for sinus infection.  Finish the Augmentin, even if you feel better.  Saline nasal irrigation with a Milta Deiters Med rinse and distilled water as often as you want, Mucinex D to help clear the nasal congestion and chest congestion.  Promethazine DM for cough.  Atrovent nasal spray to help with nasal congestion, postnasal drip.  Prednisone will help with the inflammation as well.

## 2022-04-14 NOTE — ED Triage Notes (Signed)
Pt c/o nasal congestion,cough x1 wk. Was seen on 04/07/22 for viral illness, pt states sx's getting worse.

## 2022-04-23 ENCOUNTER — Ambulatory Visit
Admission: EM | Admit: 2022-04-23 | Discharge: 2022-04-23 | Disposition: A | Discharge status: Home / Self Care | Payer: 59 | Attending: Family Medicine | Admitting: Family Medicine

## 2022-04-23 ENCOUNTER — Ambulatory Visit (INDEPENDENT_AMBULATORY_CARE_PROVIDER_SITE_OTHER): Payer: 59

## 2022-04-23 DIAGNOSIS — J40 Bronchitis, not specified as acute or chronic: Secondary | ICD-10-CM | POA: Diagnosis not present

## 2022-04-23 DIAGNOSIS — R052 Subacute cough: Secondary | ICD-10-CM

## 2022-04-23 DIAGNOSIS — R059 Cough, unspecified: Secondary | ICD-10-CM | POA: Diagnosis not present

## 2022-04-23 MED ORDER — AZITHROMYCIN 250 MG PO TABS: 250.0000 mg | ORAL_TABLET | Freq: Every day | ORAL | 0 refills | Status: DC
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
## 2022-04-23 NOTE — Discharge Instructions (Addendum)
Your chest xray did not show a pneumonia. Stop by the pharmacy to pick up your prescriptions.

## 2022-04-23 NOTE — ED Triage Notes (Addendum)
Pt c/o cough,chest congestion,fever & bodyaches x30 days. Was seen on 1/23 & 1/16 for similar complaints was given albuterol & augmentin 875mg  w/o relief.

## 2022-05-28 ENCOUNTER — Other Ambulatory Visit: Payer: Self-pay

## 2022-05-28 ENCOUNTER — Ambulatory Visit
Admission: EM | Admit: 2022-05-28 | Discharge: 2022-05-28 | Disposition: A | Discharge status: Home / Self Care | Payer: 59 | Attending: Physician Assistant | Admitting: Physician Assistant

## 2022-05-28 DIAGNOSIS — Z1152 Encounter for screening for COVID-19: Secondary | ICD-10-CM | POA: Diagnosis not present

## 2022-05-28 DIAGNOSIS — F17210 Nicotine dependence, cigarettes, uncomplicated: Secondary | ICD-10-CM | POA: Diagnosis not present

## 2022-05-28 DIAGNOSIS — R062 Wheezing: Secondary | ICD-10-CM | POA: Diagnosis not present

## 2022-05-28 DIAGNOSIS — R5383 Other fatigue: Secondary | ICD-10-CM | POA: Diagnosis not present

## 2022-05-28 DIAGNOSIS — J0300 Acute streptococcal tonsillitis, unspecified: Secondary | ICD-10-CM | POA: Diagnosis not present

## 2022-05-28 DIAGNOSIS — R052 Subacute cough: Secondary | ICD-10-CM | POA: Diagnosis not present

## 2022-05-28 LAB — RESP PANEL BY RT-PCR (RSV, FLU A&B, COVID)  RVPGX2
Influenza A by PCR: NEGATIVE
Influenza B by PCR: NEGATIVE
Resp Syncytial Virus by PCR: NEGATIVE
SARS Coronavirus 2 by RT PCR: NEGATIVE

## 2022-05-28 LAB — GROUP A STREP BY PCR: Group A Strep by PCR: DETECTED — AB

## 2022-05-28 MED ORDER — AMOXICILLIN 500 MG PO CAPS: 500.0000 mg | ORAL_CAPSULE | Freq: Two times a day (BID) | ORAL | 0 refills | Status: AC

## 2022-05-28 MED ORDER — PROMETHAZINE-DM 6.25-15 MG/5ML PO SYRP: 5.0000 mL | ORAL_SOLUTION | Freq: Four times a day (QID) | ORAL | 0 refills | Status: DC | PRN

## 2022-05-28 NOTE — Discharge Instructions (Addendum)
-  Strep is positive.  Sent antibiotics and cough medicine to pharmacy.  You should be feeling better in couple days. - Holding off on x-ray at this time since you have already had 2 this year.  If you develop fever, worsening cough, shortness of breath or you are not feeling better over the next week, return for reevaluation and consideration of x-ray to rule out pneumonia.

## 2022-05-28 NOTE — ED Triage Notes (Signed)
Pt c/o 2 wks of congestion, cough x 1 wk and sore throat since yesterday with a crackling sound and feeling in throat
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
## 2022-06-22 DIAGNOSIS — Z30432 Encounter for removal of intrauterine contraceptive device: Secondary | ICD-10-CM | POA: Diagnosis not present

## 2022-06-29 IMAGING — CR DG CHEST 2V
2 series · 2 of 2 positions shown · non-contrast
Comparison: 09/26/2015

CLINICAL DATA: Persistent cough and fever, post COVID

EXAM:
CHEST - 2 VIEW

[chest pa]
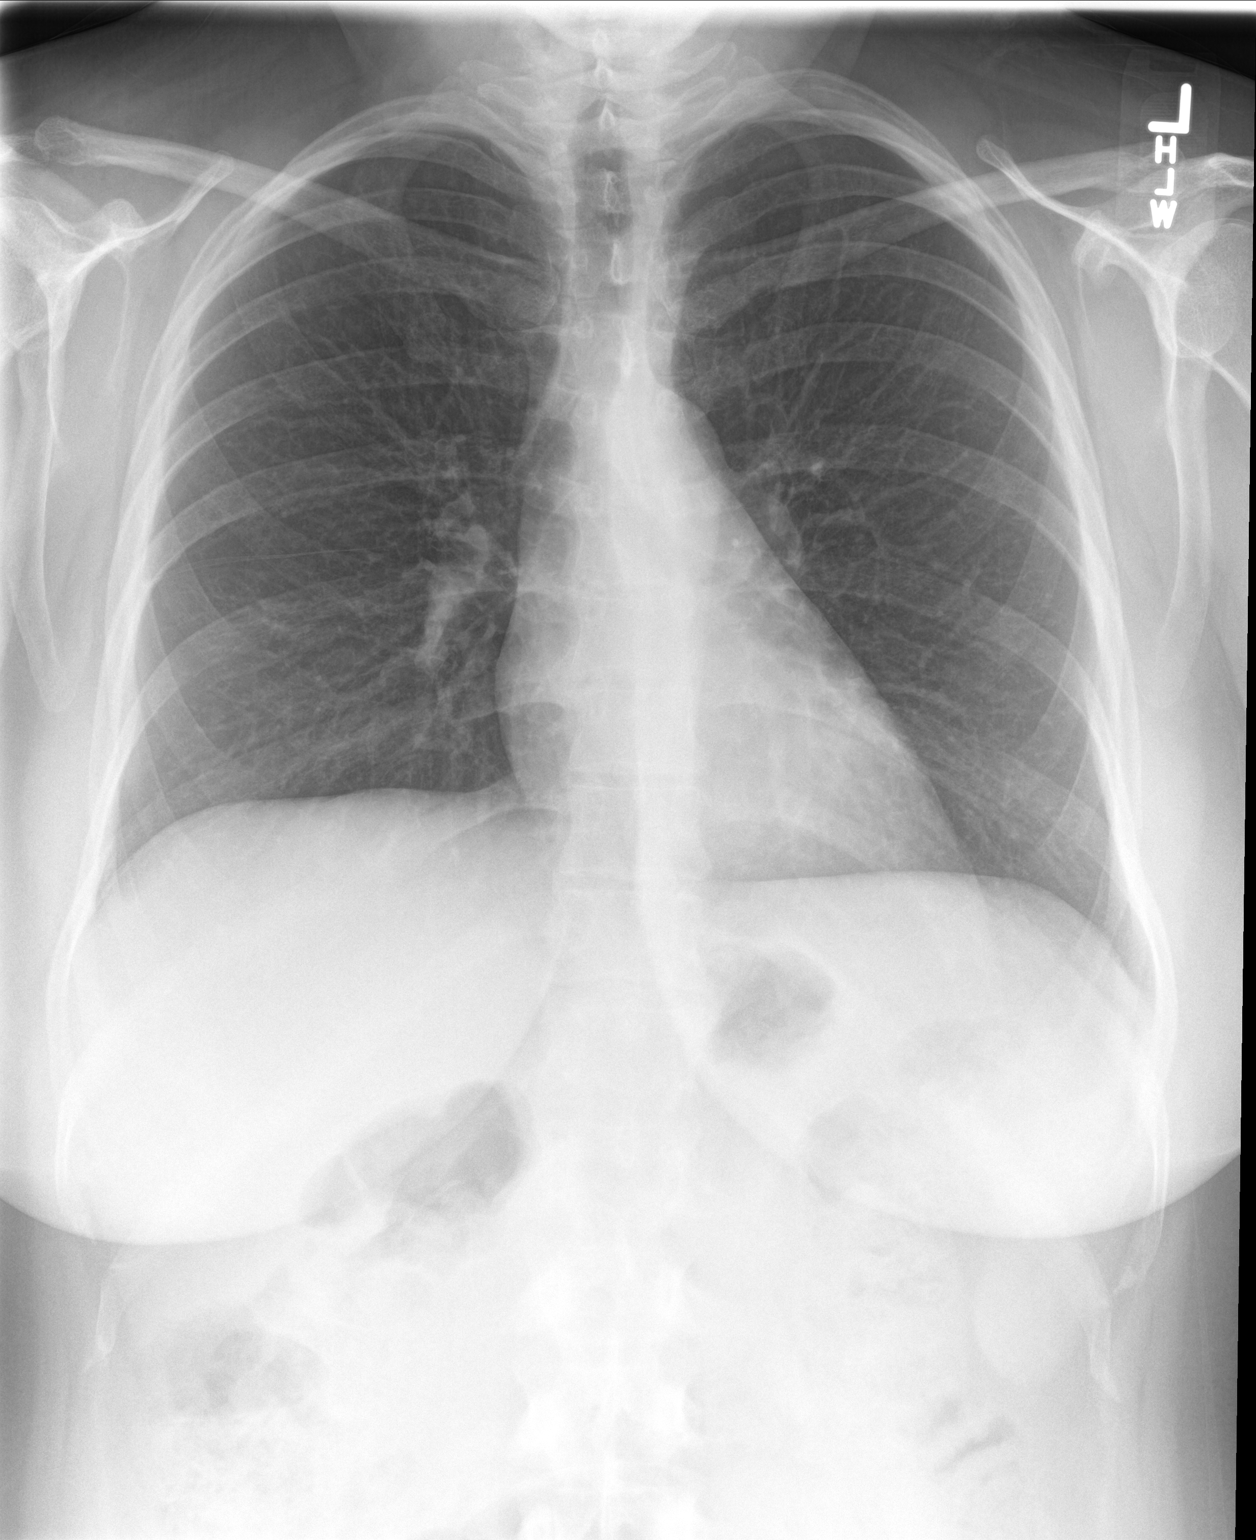

[chest lat]
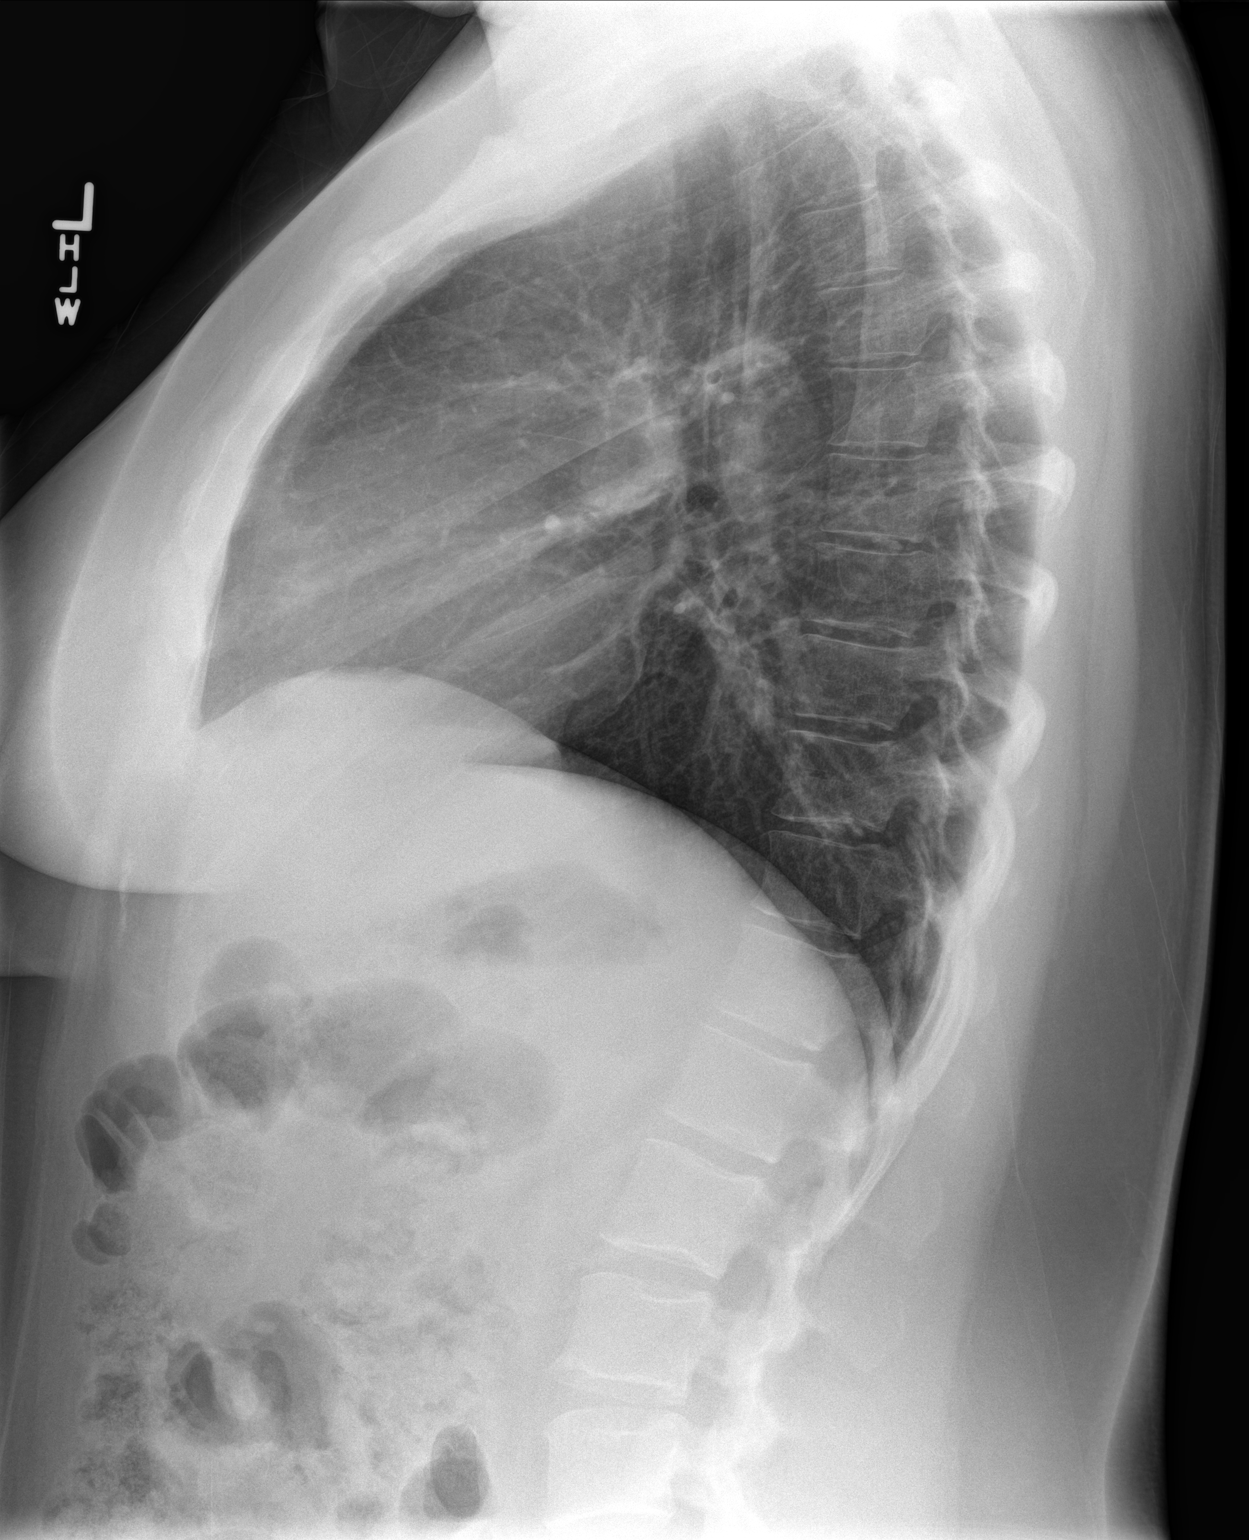

[2 of 2 positions shown; findings below may reference images not displayed]

FINDINGS: The heart size and mediastinal contours are within normal limits.
Both lungs are clear. The visualized skeletal structures are
unremarkable.
IMPRESSION: No active cardiopulmonary disease.

## 2022-10-12 DIAGNOSIS — R748 Abnormal levels of other serum enzymes: Secondary | ICD-10-CM | POA: Diagnosis not present

## 2022-10-12 DIAGNOSIS — F17210 Nicotine dependence, cigarettes, uncomplicated: Secondary | ICD-10-CM | POA: Diagnosis not present

## 2022-10-12 DIAGNOSIS — K859 Acute pancreatitis without necrosis or infection, unspecified: Secondary | ICD-10-CM | POA: Diagnosis not present

## 2022-10-12 DIAGNOSIS — K8020 Calculus of gallbladder without cholecystitis without obstruction: Secondary | ICD-10-CM | POA: Diagnosis not present

## 2022-10-12 DIAGNOSIS — K858 Other acute pancreatitis without necrosis or infection: Secondary | ICD-10-CM | POA: Diagnosis not present

## 2022-10-12 DIAGNOSIS — K8080 Other cholelithiasis without obstruction: Secondary | ICD-10-CM | POA: Diagnosis not present

## 2022-10-13 DIAGNOSIS — K859 Acute pancreatitis without necrosis or infection, unspecified: Secondary | ICD-10-CM | POA: Diagnosis not present

## 2022-10-16 DIAGNOSIS — R1013 Epigastric pain: Secondary | ICD-10-CM | POA: Diagnosis not present

## 2022-10-16 DIAGNOSIS — K851 Biliary acute pancreatitis without necrosis or infection: Secondary | ICD-10-CM | POA: Diagnosis not present

## 2022-10-17 DIAGNOSIS — K8020 Calculus of gallbladder without cholecystitis without obstruction: Secondary | ICD-10-CM | POA: Diagnosis not present

## 2022-10-17 DIAGNOSIS — K859 Acute pancreatitis without necrosis or infection, unspecified: Secondary | ICD-10-CM | POA: Diagnosis not present

## 2022-10-17 DIAGNOSIS — K851 Biliary acute pancreatitis without necrosis or infection: Secondary | ICD-10-CM | POA: Diagnosis not present

## 2022-10-18 DIAGNOSIS — K5900 Constipation, unspecified: Secondary | ICD-10-CM | POA: Diagnosis not present

## 2022-10-18 DIAGNOSIS — K851 Biliary acute pancreatitis without necrosis or infection: Secondary | ICD-10-CM | POA: Diagnosis not present

## 2022-10-18 DIAGNOSIS — K8010 Calculus of gallbladder with chronic cholecystitis without obstruction: Secondary | ICD-10-CM | POA: Diagnosis not present

## 2022-10-18 DIAGNOSIS — F17210 Nicotine dependence, cigarettes, uncomplicated: Secondary | ICD-10-CM | POA: Diagnosis not present

## 2022-10-18 DIAGNOSIS — K859 Acute pancreatitis without necrosis or infection, unspecified: Secondary | ICD-10-CM | POA: Diagnosis not present

## 2022-10-19 DIAGNOSIS — F17200 Nicotine dependence, unspecified, uncomplicated: Secondary | ICD-10-CM | POA: Insufficient documentation

## 2022-10-19 DIAGNOSIS — K5900 Constipation, unspecified: Secondary | ICD-10-CM | POA: Diagnosis not present

## 2022-10-19 DIAGNOSIS — K8010 Calculus of gallbladder with chronic cholecystitis without obstruction: Secondary | ICD-10-CM | POA: Diagnosis not present

## 2022-10-19 DIAGNOSIS — K819 Cholecystitis, unspecified: Secondary | ICD-10-CM | POA: Diagnosis not present

## 2022-10-19 DIAGNOSIS — K851 Biliary acute pancreatitis without necrosis or infection: Secondary | ICD-10-CM | POA: Diagnosis not present

## 2022-10-19 DIAGNOSIS — K859 Acute pancreatitis without necrosis or infection, unspecified: Secondary | ICD-10-CM | POA: Diagnosis not present

## 2022-10-19 HISTORY — PX: GALLBLADDER SURGERY: SHX652

## 2022-10-20 DIAGNOSIS — K851 Biliary acute pancreatitis without necrosis or infection: Secondary | ICD-10-CM | POA: Diagnosis not present

## 2022-10-21 DIAGNOSIS — F17218 Nicotine dependence, cigarettes, with other nicotine-induced disorders: Secondary | ICD-10-CM | POA: Diagnosis not present

## 2022-10-21 DIAGNOSIS — K859 Acute pancreatitis without necrosis or infection, unspecified: Secondary | ICD-10-CM | POA: Diagnosis not present

## 2022-10-26 ENCOUNTER — Telehealth: Payer: Self-pay

## 2022-10-26 NOTE — Telephone Encounter (Signed)
..   Medicaid Managed Care   Unsuccessful Outreach Note  10/26/2022 Name: Laura [name] MRN: [id number] DOB: 15-Feb-1991  Referred by: Patient, No Pcp Per Reason for referral : Appointment (I called the patient today at the request of her Managed Medicaid health plan. She did not answer but I was able to leave a message on her voicemail.)   An unsuccessful telephone outreach was attempted today. The patient was referred to the case management team for assistance with care management and care coordination.   Follow Up Plan: A HIPAA compliant phone message was left for the patient providing contact information and requesting a return call.  The care management team will reach out to the patient again over the next 7 days.   Weston Settle Care Guide  [facility] Managed  Care Guide [facility]  [phone]

## 2022-11-03 ENCOUNTER — Encounter: Payer: Self-pay | Admitting: *Deleted

## 2022-11-03 ENCOUNTER — Other Ambulatory Visit: Payer: 59 | Admitting: *Deleted

## 2022-11-03 NOTE — Patient Instructions (Signed)
Visit Information  Laura [name] was given information about Medicaid Managed Care team care coordination services as a part of their [place] [employer] Medicaid benefit. Laura [name] verbally consented to engagement with the [facility] Managed Care team.   If you are experiencing a medical emergency, please call 911 or report to your local emergency department or urgent care.   If you have a non-emergency medical problem during routine business hours, please contact your provider's office and ask to speak with a nurse.   For questions related to your [place] [employer] health plan, please call: [phone]  If you would like to schedule transportation through your [place] [employer] Medicaid plan, please call the following number at least 2 days in advance of your appointment: [phone].   There is no limit to the number of trips during the year between medical appointments, healthcare facilities, or pharmacies. Transportation must be scheduled at least 2 business days before but not more than thirty 30 days before of your appointment.  Call the Behavioral Health Crisis Line at [phone], at any time, 24 hours a day, 7 days a week. If you are in danger or need immediate medical attention call 911.  If you would like help to quit smoking, call 1-800-QUIT-NOW (513 011 6187) OR Espaol: 1-855-Djelo-Ya (6-387-564-3329) o para ms informacin haga clic aqu or Text READY to 518-841 to register via text  Laura [name] - following are the goals we discussed in your visit today:   Please see education materials related to Pancreatitis provided by MyChart link.  Patient verbalizes understanding of instructions and care plan provided today and agrees to view in MyChart. Active MyChart status and patient understanding of how to access instructions and care plan via MyChart confirmed with patient.     Telephone follow up appointment with Managed Medicaid care management team member  scheduled for:12/03/22 @ 1:15pm  Laura Emms RN, BSN [place]  Managed [facility] RN Care Coordinator [phone]   Following is a copy of your plan of care:  Care Plan : RN Care Manager Plan of Care  Updates made by Heidi Dach, RN since 11/03/2022 12:00 AM     Problem: Health Management needs related to Pancreatitis/recent Cholecystectomy      Long-Range Goal: Development of Plan of Care to address Health Management needs related to Pancreatitis/recent Cholecystectomy   Start Date: 11/03/2022  Expected End Date: 02/01/2023  Note:   Current Barriers:  Knowledge Deficits related to plan of care for management of Pancreatitis/Recent Cholecystectomy   RNCM Clinical Goal(s):  Patient will verbalize understanding of plan for management of Pancreatitis/Recent Cholecystectomy as evidenced by patient reports attend all scheduled medical appointments: schedule post op follow up and new PCP appointment as evidenced by provider documentation in EMR        continue to work with RN Care Manager and/or Social Worker to address care management and care coordination needs related to Pancreatitis/Recent Cholecystectomy as evidenced by adherence to CM Team Scheduled appointments     through collaboration with RN Care manager, provider, and care team.   Interventions: Evaluation of current treatment plan related to  self management and patient's adherence to plan as established by provider   Pancreatitis/Recent Cholecystectomy  (Status: New goal.) Long Term Goal  Evaluation of current treatment plan related to Pancreatitis/Recent Cholecystectomy,  self-management and patient's adherence to plan as established by provider. Discussed plans with patient for ongoing care management follow up and provided patient with direct contact information for care management team Advised patient to  schedule post op follow up with surgeon; Reviewed medications with patient and discussed and updated  EMR; Provided patient with MyChart educational materials related to pancreatis and MyPlate method; Assessed social determinant of health barriers;  Advised patient to establish care with PCP, provided with MD referral line [phone] Advised patient to contact [employer] [phone] member services for member benefits($120/year of OTC products, My Healthy Rewards, etc)  Patient Goals/Self-Care Activities: Call provider office for new concerns or questions  Schedule post op follow up Establish care with a PCP

## 2022-11-03 NOTE — Patient Outreach (Signed)
Medicaid Managed Care   Nurse Care Manager Note  11/03/2022 Name:  Laura [name] MRN:  [id number] DOB:  1990/07/08  GUDELIA [name] is an 32 y.o. year old female who is a primary Laura [name] of Laura [name], No Pcp Per.  The [facility] Managed Care Coordination team was consulted for assistance with:    Pancreatitis/Recent Cholecystectomy  [name] was given information about Medicaid Managed Care Coordination team services today. Laura [name] Laura [name] agreed to services and verbal consent obtained.  Engaged with Laura [name] by telephone for initial visit in response to provider referral for case management and/or care coordination services.   Assessments/Interventions:  Review of past medical history, allergies, medications, health status, including review of consultants reports, laboratory and other test data, was performed as part of comprehensive evaluation and provision of chronic care management services.  SDOH (Social Determinants of Health) assessments and interventions performed: SDOH Interventions    Flowsheet Row Laura [name] Outreach Telephone from 11/03/2022 in [place] POPULATION HE[facility] DEPARTMENT  SDOH Interventions   Food Insecurity Interventions Intervention Not Indicated  [Laura [name] planning to apply for food assistance]  Housing Interventions Intervention Not Indicated  Transportation Interventions Intervention Not Indicated  Utilities Interventions Intervention Not Indicated       Care Plan  No Known Allergies  Medications Reviewed Today     Reviewed by Heidi Dach, RN (Registered Nurse) on 11/03/22 at 1325  Med List Status: <None>   Medication Order Taking? Sig Documenting Provider Last Dose Status Informant  albuterol (VENTOLIN HFA) 108 (90 Base) MCG/ACT inhaler [id number] No Inhale 1-2 puffs into the lungs every 6 (six) hours as needed for wheezing or shortness of breath.  Laura [name] not taking: Reported on 11/03/2022   Coralyn Mark, NP Not Taking Consider  Medication Status and Discontinue (Completed Course)   albuterol (VENTOLIN HFA) 108 (90 Base) MCG/ACT inhaler [id number] Yes Inhale 1-2 puffs into the lungs every 4 (four) hours as needed for wheezing or shortness of breath. Domenick Gong, MD Taking Active   ibuprofen (ADVIL) 600 MG tablet [id number] Yes Take 1 tablet (600 mg total) by mouth every 6 (six) hours as needed. Merrilee Jansky, MD Taking Active   ipratropium (ATROVENT) 0.06 % nasal spray [id number] No Place 2 sprays into both nostrils 4 (four) times daily.  Laura [name] not taking: Reported on 11/03/2022   Domenick Gong, MD Not Taking Active   levonorgestrel (MIRENA) 20 MCG/24HR IUD [id number] No 1 each by Intrauterine route once.  Laura [name] not taking: Reported on 11/03/2022   [provider] Not Taking Consider Medication Status and Discontinue (Change in therapy) Self  ondansetron (ZOFRAN-ODT) 4 MG disintegrating tablet [id number] No Take 1 tablet (4 mg total) by mouth every 8 (eight) hours as needed for nausea or vomiting.  Laura [name] not taking: Reported on 11/03/2022   Merrilee Jansky, MD Not Taking Consider Medication Status and Discontinue (No longer needed (for PRN medications))   promethazine-dextromethorphan (PROMETHAZINE-DM) 6.25-15 MG/5ML syrup [id number] No Take 5 mLs by mouth 4 (four) times daily as needed.  Laura [name] not taking: Reported on 11/03/2022   Gareth Morgan Not Taking Active   Spacer/Aero-Holding Chambers (AEROCHAMBER MV) inhaler [id number] No Use as instructed  Laura [name] not taking: Reported on 11/03/2022   Domenick Gong, MD Not Taking Active             Laura [name] Active Problem List   Diagnosis Date Noted   Strep pharyngitis 04/15/2018   Acute streptococcal tonsillitis 04/15/2018   Post-concussion headache 09/20/2017  BMI 36.0-36.9,adult 05/17/2016    Conditions to be addressed/monitored per PCP order:   Pancreatitis/Recent Cholecystectomy  Care Plan : RN Care Manager Plan of Care   Updates made by Heidi Dach, RN since 11/03/2022 12:00 AM     Problem: Health Management needs related to Pancreatitis/recent Cholecystectomy      Long-Range Goal: Development of Plan of Care to address Health Management needs related to Pancreatitis/recent Cholecystectomy   Start Date: 11/03/2022  Expected End Date: 02/01/2023  Note:   Current Barriers:  Knowledge Deficits related to plan of care for management of Pancreatitis/Recent Cholecystectomy   RNCM Clinical Goal(s):  Laura [name] will verbalize understanding of plan for management of Pancreatitis/Recent Cholecystectomy as evidenced by Laura [name] reports attend all scheduled medical appointments: schedule post op follow up and new PCP appointment as evidenced by provider documentation in EMR        continue to work with RN Care Manager and/or Social Worker to address care management and care coordination needs related to Pancreatitis/Recent Cholecystectomy as evidenced by adherence to CM Team Scheduled appointments     through collaboration with RN Care manager, provider, and care team.   Interventions: Evaluation of current treatment plan related to  self management and Laura [name]'s adherence to plan as established by provider   Pancreatitis/Recent Cholecystectomy  (Status: New goal.) Long Term Goal  Evaluation of current treatment plan related to Pancreatitis/Recent Cholecystectomy,  self-management and Laura [name]'s adherence to plan as established by provider. Discussed plans with Laura [name] for ongoing care management follow up and provided Laura [name] with direct contact information for care management team Advised Laura [name] to schedule post op follow up with surgeon; Reviewed medications with Laura [name] and discussed and updated EMR; Provided Laura [name] with MyChart educational materials related to pancreatis and MyPlate method; Assessed social determinant of health barriers;  Advised Laura [name] to establish care with PCP, provided with MD referral  line [phone] Advised Laura [name] to contact [employer] [phone] member services for member benefits($120/year of OTC products, My Healthy Rewards, etc)  Laura [name] Goals/Self-Care Activities: Call provider office for new concerns or questions  Schedule post op follow up Establish care with a PCP       Follow Up:  Laura [name] agrees to Care Plan and Follow-up.  Plan: The Managed Medicaid care management team will reach out to the Laura [name] again over the next 30 days.  Date/time of next scheduled RN care management/care coordination outreach:  12/03/22 @ 1:15pm  Estanislado Emms RN, BSN [place]  Managed [facility] RN Care Coordinator [phone]

## 2022-11-09 DIAGNOSIS — R1013 Epigastric pain: Secondary | ICD-10-CM | POA: Diagnosis not present

## 2022-12-03 ENCOUNTER — Other Ambulatory Visit: Payer: Medicaid Other | Admitting: *Deleted

## 2022-12-03 NOTE — Patient Instructions (Signed)
Visit Information  Ms. Oris Drone  - as a part of your Medicaid benefit, you are eligible for care management and care coordination services at no cost or copay. I was unable to reach you by phone today but would be happy to help you with your health related needs. Please feel free to call me @ [phone].   A member of the Managed Medicaid care management team will reach out to you again over the next 7 days.   Estanislado Emms RN, BSN [place]  Value-Based [facility] RN Care Coordinator [phone]

## 2022-12-03 NOTE — Patient Outreach (Signed)
  Medicaid Managed Care   Unsuccessful Attempt Note   12/03/2022 Name: Laura [name] MRN: [id number] DOB: 10-Jan-1991  Referred by: Patient, No Pcp Per Reason for referral : High Risk Managed Medicaid (Unsuccessful RNCM follow up telephone outreach)   An unsuccessful telephone outreach was attempted today. The patient was referred to the case management team for assistance with care management and care coordination.    Follow Up Plan: A HIPAA compliant phone message was left for the patient providing contact information and requesting a return call. and The Managed Medicaid care management team will reach out to the patient again over the next 7 days.    Estanislado Emms RN, BSN [place]  Value-Based Care [facility] Health RN Care Coordinator [phone]

## 2022-12-09 ENCOUNTER — Telehealth: Payer: Self-pay

## 2022-12-09 NOTE — Telephone Encounter (Signed)
..  Medicaid Managed Care   Unsuccessful Outreach Note  12/09/2022 Name: Laura [name] MRN: [id number] DOB: 1990-05-15  Referred by: Patient, No Pcp Per Reason for referral : Appointment (I called the patient today to get her missed phone appointment rescheduled with the MM RNCM.)   A second unsuccessful telephone outreach was attempted today. The patient was referred to the case management team for assistance with care management and care coordination.   Follow Up Plan: A HIPAA compliant phone message was left for the patient providing contact information and requesting a return call.  The care management team will reach out to the patient again over the next 7 days.   Weston Settle Care Guide  [facility] Managed  Care Guide [facility]  [phone]

## 2022-12-10 ENCOUNTER — Ambulatory Visit (INDEPENDENT_AMBULATORY_CARE_PROVIDER_SITE_OTHER): Payer: Managed Care, Other (non HMO) | Admitting: Obstetrics

## 2022-12-10 ENCOUNTER — Encounter: Payer: Self-pay | Admitting: Obstetrics

## 2022-12-10 VITALS — BP 115/88 | HR 101 | Ht 61.0 in | Wt 206.0 lb

## 2022-12-10 DIAGNOSIS — Z7689 Persons encountering health services in other specified circumstances: Secondary | ICD-10-CM

## 2022-12-10 DIAGNOSIS — O0990 Supervision of high risk pregnancy, unspecified, unspecified trimester: Secondary | ICD-10-CM | POA: Insufficient documentation

## 2022-12-10 DIAGNOSIS — O3680X1 Pregnancy with inconclusive fetal viability, fetus 1: Secondary | ICD-10-CM

## 2022-12-10 DIAGNOSIS — N912 Amenorrhea, unspecified: Secondary | ICD-10-CM

## 2022-12-10 DIAGNOSIS — Z3200 Encounter for pregnancy test, result unknown: Secondary | ICD-10-CM

## 2022-12-10 DIAGNOSIS — Z3201 Encounter for pregnancy test, result positive: Secondary | ICD-10-CM

## 2022-12-10 DIAGNOSIS — Z3480 Encounter for supervision of other normal pregnancy, unspecified trimester: Secondary | ICD-10-CM | POA: Insufficient documentation

## 2022-12-10 DIAGNOSIS — Z8632 Personal history of gestational diabetes: Secondary | ICD-10-CM | POA: Insufficient documentation

## 2022-12-10 LAB — POCT URINE PREGNANCY: Preg Test, Ur: POSITIVE — AB

## 2022-12-10 NOTE — Progress Notes (Signed)
Pregnancy Confirmation Visit  SUBJECTIVE  Laura [name] is a 32 y.o. female who presents for evaluation of amenorrhea and possible pregnancy. Patient's last menstrual period was 11/04/2022 (exact date). and was Normal. She reports regular monthly periods. She has a history of GDM. Pregnancy is desired. Current symptoms include fatigue and nausea  Review of Systems Pertinent items are noted in HPI.  OBJECTIVE BP 115/88   Pulse (!) 101   Ht 5\' 1"  (1.549 m)   Wt 206 lb (93.4 kg)   LMP 11/04/2022 (Exact Date)   BMI 38.92 kg/m  Body mass index is 38.92 kg/m.  alert, well appearing, and in no distress  Lab Review Urine hCG: positive  ASSESSMENT Amenorrhea.  Presumed pregnancy at [redacted]w[redacted]d with EDD of Estimated Date of Delivery: 08/11/23.  PLAN  Encouraged a well-balanced diet, rest, hydration, prenatal vitamins, and walking for exercise. Counseled to avoid alcohol, tobacco, and recreational drugs and to minimize caffeine intake. Safe medications list given. Discussed non-pharmacologic relief measures for nausea. Oriented to [facility]. A dating [place] was ordered. Schedule nurse intake visit. Return in 5-7 for labs, NOB physical/Pap, and genetic screening if desired.   Guadlupe Spanish, CNM

## 2022-12-15 ENCOUNTER — Ambulatory Visit (INDEPENDENT_AMBULATORY_CARE_PROVIDER_SITE_OTHER): Payer: Managed Care, Other (non HMO)

## 2022-12-15 VITALS — Wt 206.0 lb

## 2022-12-15 DIAGNOSIS — Z3689 Encounter for other specified antenatal screening: Secondary | ICD-10-CM

## 2022-12-15 DIAGNOSIS — Z3480 Encounter for supervision of other normal pregnancy, unspecified trimester: Secondary | ICD-10-CM

## 2022-12-15 NOTE — Progress Notes (Signed)
New OB Intake  I connected with  MIRHA VARONE on 12/15/22 at  3:15 PM EDT by face to face and verified that I am speaking with the correct person using two identifiers. Nurse is located at [facility] and pt is located in office.  I explained I am completing New OB Intake today. We discussed her EDD of 08/11/2023 that is based on LMP of 11/04/2022. Pt is G3/P2002. I reviewed her allergies, medications, Medical/Surgical/OB history, and a3ppropriate screenings. There is a cat outside the home. Outdoor Based on history, this is a/an pregnancy uncomplicated .   Patient Active Problem List   Diagnosis Date Noted   Supervision of other normal pregnancy, antepartum 12/10/2022   History of gestational diabetes mellitus (GDM) 12/10/2022   BMI 36.0-36.9,adult 05/17/2016    Concerns addressed today None  Delivery Plans:  Plans to deliver at [facility].  Anatomy US Explained first scheduled [place] will be October 3rd and an anatomy scan will be done at 20 weeks.  Labs Discussed genetic screening with patient. Patient desires genetic testing to be drawn at new OB visit. Discussed possible labs to be drawn at new OB appointment.  COVID Vaccine Patient has had COVID vaccine.   Social Determinants of Health Food Insecurity: denies food insecurity Transportation: Patient denies transportation needs. Childcare: Discussed no children allowed at ultrasound appointments.   First visit review I reviewed new OB appt with pt. I explained she will have ob bloodwork and pap smear/pelvic exam if indicated. Explained pt will be seen by Autumn Messing, CNM at first visit; encounter routed to appropriate provider.   Loran Senters, [place] 12/15/2022  3:52 PM

## 2022-12-15 NOTE — Patient Instructions (Signed)
First Trimester of Pregnancy  The first trimester of pregnancy starts on the first day of your last menstrual period until the end of week 12. This is also called months 1 through 3 of pregnancy. Body changes during your first trimester Your body goes through many changes during pregnancy. The changes usually return to normal after your baby is born. Physical changes You may gain or lose weight. Your breasts may grow larger and hurt. The area around your nipples may get darker. Dark spots or blotches may develop on your face. You may have changes in your hair. Health changes You may feel like you might vomit (nauseous), and you may vomit. You may have heartburn. You may have headaches. You may have trouble pooping (constipation). Your gums may bleed. Other changes You may get tired easily. You may pee (urinate) more often. Your menstrual periods will stop. You may not feel hungry. You may want to eat certain kinds of food. You may have changes in your emotions from day to day. You may have more dreams. Follow these instructions at home: Medicines Take over-the-counter and prescription medicines only as told by your doctor. Some medicines are not safe during pregnancy. Take a prenatal vitamin that contains at least 600 micrograms (mcg) of folic acid. Eating and drinking Eat healthy meals that include: Fresh fruits and vegetables. Whole grains. Good sources of protein, such as meat, eggs, or tofu. Low-fat dairy products. Avoid raw meat and unpasteurized juice, milk, and cheese. If you feel like you may vomit, or you vomit: Eat 4 or 5 small meals a day instead of 3 large meals. Try eating a few soda crackers. Drink liquids between meals instead of during meals. You may need to take these actions to prevent or treat trouble pooping: Drink enough fluids to keep your pee (urine) pale yellow. Eat foods that are high in fiber. These include beans, whole grains, and fresh fruits and  vegetables. Limit foods that are high in fat and sugar. These include fried or sweet foods. Activity Exercise only as told by your doctor. Most people can do their usual exercise routine during pregnancy. Stop exercising if you have cramps or pain in your lower belly (abdomen) or low back. Do not exercise if it is too hot or too humid, or if you are in a place of great height (high altitude). Avoid heavy lifting. If you choose to, you may have sex unless your doctor tells you not to. Relieving pain and discomfort Wear a good support bra if your breasts are sore. Rest with your legs raised (elevated) if you have leg cramps or low back pain. If you have bulging veins (varicose veins) in your legs: Wear support hose as told by your doctor. Raise your feet for 15 minutes, 3-4 times a day. Limit salt in your food. Safety Wear your seat belt at all times when you are in a car. Talk with your doctor if someone is hurting you or yelling at you. Talk with your doctor if you are feeling sad or have thoughts of hurting yourself. Lifestyle Do not use hot tubs, steam rooms, or saunas. Do not douche. Do not use tampons or scented sanitary pads. Do not use herbal medicines, illegal drugs, or medicines that are not approved by your doctor. Do not drink alcohol. Do not smoke or use any products that contain nicotine or tobacco. If you need help quitting, ask your doctor. Avoid cat litter boxes and soil that is used by cats. These carry   germs that can cause harm to the baby and can cause a loss of your baby by miscarriage or stillbirth. General instructions Keep all follow-up visits. This is important. Ask for help if you need counseling or if you need help with nutrition. Your doctor can give you advice or tell you where to go for help. Visit your dentist. At home, brush your teeth with a soft toothbrush. Floss gently. Write down your questions. Take them to your prenatal visits. Where to find more  information American Pregnancy Association: americanpregnancy.org American College of Obstetricians and Gynecologists: www.acog.org Office on Women's Health: womenshealth.gov/pregnancy Contact a doctor if: You are dizzy. You have a fever. You have mild cramps or pressure in your lower belly. You have a nagging pain in your belly area. You continue to feel like you may vomit, you vomit, or you have watery poop (diarrhea) for 24 hours or longer. You have a bad-smelling fluid coming from your vagina. You have pain when you pee. You are exposed to a disease that spreads from person to person, such as chickenpox, measles, Zika virus, HIV, or hepatitis. Get help right away if: You have spotting or bleeding from your vagina. You have very bad belly cramping or pain. You have shortness of breath or chest pain. You have any kind of injury, such as from a fall or a car crash. You have new or increased pain, swelling, or redness in an arm or leg. Summary The first trimester of pregnancy starts on the first day of your last menstrual period until the end of week 12 (months 1 through 3). Eat 4 or 5 small meals a day instead of 3 large meals. Do not smoke or use any products that contain nicotine or tobacco. If you need help quitting, ask your doctor. Keep all follow-up visits. This information is not intended to replace advice given to you by your health care provider. Make sure you discuss any questions you have with your health care provider. Document Revised: 08/16/2019 Document Reviewed: 06/22/2019 Elsevier Patient Education  2024 Elsevier Inc. Commonly Asked Questions During Pregnancy  Cats: A parasite can be excreted in cat feces.  To avoid exposure you need to have another person empty the little box.  If you must empty the litter box you will need to wear gloves.  Wash your hands after handling your cat.  This parasite can also be found in raw or undercooked meat so this should also be  avoided.  Colds, Sore Throats, Flu: Please check your medication sheet to see what you can take for symptoms.  If your symptoms are unrelieved by these medications please call the office.  Dental Work: Most any dental work your dentist recommends is permitted.  X-rays should only be taken during the first trimester if absolutely necessary.  Your abdomen should be shielded with a lead apron during all x-rays.  Please notify your provider prior to receiving any x-rays.  Novocaine is fine; gas is not recommended.  If your dentist requires a note from us prior to dental work please call the office and we will provide one for you.  Exercise: Exercise is an important part of staying healthy during your pregnancy.  You may continue most exercises you were accustomed to prior to pregnancy.  Later in your pregnancy you will most likely notice you have difficulty with activities requiring balance like riding a bicycle.  It is important that you listen to your body and avoid activities that put you at a higher   risk of falling.  Adequate rest and staying well hydrated are a must!  If you have questions about the safety of specific activities ask your provider.    Exposure to Children with illness: Try to avoid obvious exposure; report any symptoms to us when noted,  If you have chicken pos, red measles or mumps, you should be immune to these diseases.   Please do not take any vaccines while pregnant unless you have checked with your OB provider.  Fetal Movement: After 28 weeks we recommend you do "kick counts" twice daily.  Lie or sit down in a calm quiet environment and count your baby movements "kicks".  You should feel your baby at least 10 times per hour.  If you have not felt 10 kicks within the first hour get up, walk around and have something sweet to eat or drink then repeat for an additional hour.  If count remains less than 10 per hour notify your provider.  Fumigating: Follow your pest control agent's  advice as to how long to stay out of your home.  Ventilate the area well before re-entering.  Hemorrhoids:   Most over-the-counter preparations can be used during pregnancy.  Check your medication to see what is safe to use.  It is important to use a stool softener or fiber in your diet and to drink lots of liquids.  If hemorrhoids seem to be getting worse please call the office.   Hot Tubs:  Hot tubs Jacuzzis and saunas are not recommended while pregnant.  These increase your internal body temperature and should be avoided.  Intercourse:  Sexual intercourse is safe during pregnancy as long as you are comfortable, unless otherwise advised by your provider.  Spotting may occur after intercourse; report any bright red bleeding that is heavier than spotting.  Labor:  If you know that you are in labor, please go to the hospital.  If you are unsure, please call the office and let us help you decide what to do.  Lifting, straining, etc:  If your job requires heavy lifting or straining please check with your provider for any limitations.  Generally, you should not lift items heavier than that you can lift simply with your hands and arms (no back muscles)  Painting:  Paint fumes do not harm your pregnancy, but may make you ill and should be avoided if possible.  Latex or water based paints have less odor than oils.  Use adequate ventilation while painting.  Permanents & Hair Color:  Chemicals in hair dyes are not recommended as they cause increase hair dryness which can increase hair loss during pregnancy.  " Highlighting" and permanents are allowed.  Dye may be absorbed differently and permanents may not hold as well during pregnancy.  Sunbathing:  Use a sunscreen, as skin burns easily during pregnancy.  Drink plenty of fluids; avoid over heating.  Tanning Beds:  Because their possible side effects are still unknown, tanning beds are not recommended.  Ultrasound Scans:  Routine ultrasounds are performed  at approximately 20 weeks.  You will be able to see your baby's general anatomy an if you would like to know the gender this can usually be determined as well.  If it is questionable when you conceived you may also receive an ultrasound early in your pregnancy for dating purposes.  Otherwise ultrasound exams are not routinely performed unless there is a medical necessity.  Although you can request a scan we ask that you pay for it when   conducted because insurance does not cover " patient request" scans.  Work: If your pregnancy proceeds without complications you may work until your due date, unless your physician or employer advises otherwise.  Round Ligament Pain/Pelvic Discomfort:  Sharp, shooting pains not associated with bleeding are fairly common, usually occurring in the second trimester of pregnancy.  They tend to be worse when standing up or when you remain standing for long periods of time.  These are the result of pressure of certain pelvic ligaments called "round ligaments".  Rest, Tylenol and heat seem to be the most effective relief.  As the womb and fetus grow, they rise out of the pelvis and the discomfort improves.  Please notify the office if your pain seems different than that described.  It may represent a more serious condition.  Common Medications Safe in Pregnancy  Acne:      Constipation:  Benzoyl Peroxide     Colace  Clindamycin      Dulcolax Suppository  Topica Erythromycin     Fibercon  Salicylic Acid      Metamucil         Miralax AVOID:        Senakot   Accutane    Cough:  Retin-A       Cough Drops  Tetracycline      Phenergan w/ Codeine if Rx  Minocycline      Robitussin (Plain & DM)  Antibiotics:     Crabs/Lice:  Ceclor       RID  Cephalosporins    AVOID:  E-Mycins      Kwell  Keflex  Macrobid/Macrodantin   Diarrhea:  Penicillin      Kao-Pectate  Zithromax      Imodium AD         PUSH FLUIDS AVOID:       Cipro     Fever:  Tetracycline      Tylenol (Regular  or Extra  Minocycline       Strength)  Levaquin      Extra Strength-Do not          Exceed 8 tabs/24 hrs Caffeine:        <200mg/day (equiv. To 1 cup of coffee or  approx. 3 12 oz sodas)         Gas: Cold/Hayfever:       Gas-X  Benadryl      Mylicon  Claritin       Phazyme  **Claritin-D        Chlor-Trimeton    Headaches:  Dimetapp      ASA-Free Excedrin  Drixoral-Non-Drowsy     Cold Compress  Mucinex (Guaifenasin)     Tylenol (Regular or Extra  Sudafed/Sudafed-12 Hour     Strength)  **Sudafed PE Pseudoephedrine   Tylenol Cold & Sinus     Vicks Vapor Rub  Zyrtec  **AVOID if Problems With Blood Pressure         Heartburn: Avoid lying down for at least 1 hour after meals  Aciphex      Maalox     Rash:  Milk of Magnesia     Benadryl    Mylanta       1% Hydrocortisone Cream  Pepcid  Pepcid Complete   Sleep Aids:  Prevacid      Ambien   Prilosec       Benadryl  Rolaids       Chamomile Tea  Tums (Limit 4/day)     Unisom           Tylenol PM         Warm milk-add vanilla or  Hemorrhoids:       Sugar for taste  Anusol/Anusol H.C.  (RX: Analapram 2.5%)  Sugar Substitutes:  Hydrocortisone OTC     Ok in moderation  Preparation H      Tucks        Vaseline lotion applied to tissue with wiping    Herpes:     Throat:  Acyclovir      Oragel  Famvir  Valtrex     Vaccines:         Flu Shot Leg Cramps:       *Gardasil  Benadryl      Hepatitis A         Hepatitis B Nasal Spray:       Pneumovax  Saline Nasal Spray     Polio Booster         Tetanus Nausea:       Tuberculosis test or PPD  Vitamin B6 25 mg TID   AVOID:    Dramamine      *Gardasil  Emetrol       Live Poliovirus  Ginger Root 250 mg QID    MMR (measles, mumps &  High Complex Carbs @ Bedtime    rebella)  Sea Bands-Accupressure    Varicella (Chickenpox)  Unisom 1/2 tab TID     *No known complications           If received before Pain:         Known pregnancy;   Darvocet       Resume series  after  Lortab        Delivery  Percocet    Yeast:   Tramadol      Femstat  Tylenol 3      Gyne-lotrimin  Ultram       Monistat  Vicodin           MISC:         All Sunscreens           Hair Coloring/highlights          Insect Repellant's          (Including DEET)         Mystic Tans  

## 2022-12-24 ENCOUNTER — Ambulatory Visit: Payer: Managed Care, Other (non HMO)

## 2022-12-24 ENCOUNTER — Other Ambulatory Visit: Payer: Self-pay | Admitting: Obstetrics

## 2022-12-24 DIAGNOSIS — O3680X0 Pregnancy with inconclusive fetal viability, not applicable or unspecified: Secondary | ICD-10-CM | POA: Diagnosis not present

## 2022-12-24 DIAGNOSIS — Z8632 Personal history of gestational diabetes: Secondary | ICD-10-CM

## 2022-12-24 DIAGNOSIS — Z3480 Encounter for supervision of other normal pregnancy, unspecified trimester: Secondary | ICD-10-CM

## 2022-12-24 DIAGNOSIS — O3680X1 Pregnancy with inconclusive fetal viability, fetus 1: Secondary | ICD-10-CM

## 2022-12-24 DIAGNOSIS — Z3A01 Less than 8 weeks gestation of pregnancy: Secondary | ICD-10-CM

## 2022-12-24 DIAGNOSIS — Z3200 Encounter for pregnancy test, result unknown: Secondary | ICD-10-CM

## 2022-12-24 DIAGNOSIS — Z7689 Persons encountering health services in other specified circumstances: Secondary | ICD-10-CM

## 2022-12-25 ENCOUNTER — Telehealth: Payer: Self-pay

## 2022-12-25 ENCOUNTER — Other Ambulatory Visit: Payer: Managed Care, Other (non HMO) | Admitting: *Deleted

## 2022-12-25 NOTE — Patient Outreach (Signed)
   Care Management/Care Coordination  RN Case Manager Case Closure Note  12/25/2022 Name: CATHYANN KILFOYLE MRN: [id number] DOB: 1990-08-08  SHAQUOIA MIERS is a 32 y.o. year old female who is a primary care patient of Patient, No Pcp Per. The care management/care coordination team was consulted for assistance with chronic disease management and/or care coordination needs.   Care Plan : RN Care Manager Plan of Care  Updates made by Heidi Dach, RN since 12/25/2022 12:00 AM  Completed 12/25/2022   Problem: Health Management needs related to Pancreatitis/recent Cholecystectomy Resolved 12/25/2022     Long-Range Goal: Development of Plan of Care to address Health Management needs related to Pancreatitis/recent Cholecystectomy Completed 12/25/2022  Start Date: 11/03/2022  Expected End Date: 02/01/2023  Note:   Current Barriers:  Knowledge Deficits related to plan of care for management of Pancreatitis/Recent Cholecystectomy  Unable to maintain contact with patient  RNCM Clinical Goal(s):  Patient will verbalize understanding of plan for management of Pancreatitis/Recent Cholecystectomy as evidenced by patient reports attend all scheduled medical appointments: schedule post op follow up and new PCP appointment as evidenced by provider documentation in EMR        continue to work with RN Care Manager and/or Social Worker to address care management and care coordination needs related to Pancreatitis/Recent Cholecystectomy as evidenced by adherence to CM Team Scheduled appointments     through collaboration with RN Care manager, provider, and care team.   Interventions: Evaluation of current treatment plan related to  self management and patient's adherence to plan as established by provider   Pancreatitis/Recent Cholecystectomy  (Status: Goal Not Met.) Long Term Goal  Evaluation of current treatment plan related to Pancreatitis/Recent Cholecystectomy,  self-management and patient's adherence  to plan as established by provider. Discussed plans with patient for ongoing care management follow up and provided patient with direct contact information for care management team Advised patient to schedule post op follow up with surgeon; Reviewed medications with patient and discussed and updated EMR; Provided patient with MyChart educational materials related to pancreatis and MyPlate method; Assessed social determinant of health barriers;  Advised patient to establish care with PCP, provided with MD referral line [phone] Advised patient to contact [employer] [phone] member services for member benefits($120/year of OTC products, My [employer] Rewards, etc)  Patient Goals/Self-Care Activities: Call provider office for new concerns or questions  Schedule post op follow up Establish care with a PCP       Plan: We have been unable to make contact with the patient for follow up. The care management team is available to follow up with the patient should new care management/care coordination needs arise.   Estanislado Emms RN, BSN [place]  [facility] RN Care Coordinator [phone]

## 2022-12-25 NOTE — Telephone Encounter (Signed)
..   Medicaid Managed Care   Unsuccessful Outreach Note  12/25/2022 Name: Laura [name] MRN: [id number] DOB: 1990-12-20  Referred by: Patient, No Pcp Per Reason for referral : Appointment   Third unsuccessful telephone outreach was attempted today. The patient was referred to the case management team for assistance with care management and care coordination. The patient's primary care provider has been notified of our unsuccessful attempts to make or maintain contact with the patient. The care management team is pleased to engage with this patient at any time in the future should he/she be interested in assistance from the care management team.   Follow Up Plan: We have been unable to make contact with the patient for follow up. The care management team is available to follow up with the patient after provider conversation with the patient regarding recommendation for care management engagement and subsequent re-referral to the care management team.   Weston Settle Care Guide  [facility] Managed  Care Guide [facility]  [phone]

## 2023-01-09 ENCOUNTER — Encounter: Payer: Self-pay | Admitting: Obstetrics

## 2023-01-27 ENCOUNTER — Ambulatory Visit (INDEPENDENT_AMBULATORY_CARE_PROVIDER_SITE_OTHER): Payer: Managed Care, Other (non HMO) | Admitting: Obstetrics and Gynecology

## 2023-01-27 ENCOUNTER — Other Ambulatory Visit (HOSPITAL_COMMUNITY)
Admission: RE | Admit: 2023-01-27 | Discharge: 2023-01-27 | Disposition: A | Discharge status: Home / Self Care | Payer: Managed Care, Other (non HMO) | Source: Ambulatory Visit

## 2023-01-27 ENCOUNTER — Encounter: Payer: Self-pay | Admitting: Obstetrics and Gynecology

## 2023-01-27 VITALS — BP 111/69 | HR 94 | Ht 61.0 in | Wt 212.0 lb

## 2023-01-27 DIAGNOSIS — Z1379 Encounter for other screening for genetic and chromosomal anomalies: Secondary | ICD-10-CM | POA: Diagnosis present

## 2023-01-27 DIAGNOSIS — Z113 Encounter for screening for infections with a predominantly sexual mode of transmission: Secondary | ICD-10-CM

## 2023-01-27 DIAGNOSIS — Z3481 Encounter for supervision of other normal pregnancy, first trimester: Secondary | ICD-10-CM

## 2023-01-27 DIAGNOSIS — Z3A12 12 weeks gestation of pregnancy: Secondary | ICD-10-CM

## 2023-01-27 DIAGNOSIS — O0991 Supervision of high risk pregnancy, unspecified, first trimester: Secondary | ICD-10-CM

## 2023-01-27 DIAGNOSIS — O09299 Supervision of pregnancy with other poor reproductive or obstetric history, unspecified trimester: Secondary | ICD-10-CM

## 2023-01-27 DIAGNOSIS — Z8632 Personal history of gestational diabetes: Secondary | ICD-10-CM

## 2023-01-27 DIAGNOSIS — R110 Nausea: Secondary | ICD-10-CM

## 2023-01-27 DIAGNOSIS — Z3480 Encounter for supervision of other normal pregnancy, unspecified trimester: Secondary | ICD-10-CM | POA: Insufficient documentation

## 2023-01-27 DIAGNOSIS — O99211 Obesity complicating pregnancy, first trimester: Secondary | ICD-10-CM

## 2023-01-27 DIAGNOSIS — Z124 Encounter for screening for malignant neoplasm of cervix: Secondary | ICD-10-CM

## 2023-01-27 DIAGNOSIS — Z0283 Encounter for blood-alcohol and blood-drug test: Secondary | ICD-10-CM

## 2023-01-27 MED ORDER — BONJESTA 20-20 MG PO TBCR: 1.0000 | EXTENDED_RELEASE_TABLET | ORAL | 1 refills | Status: DC

## 2023-01-27 MED ORDER — ASPIRIN 81 MG PO TBEC: 81.0000 mg | DELAYED_RELEASE_TABLET | Freq: Every day | ORAL | 2 refills | Status: DC

## 2023-01-27 NOTE — Progress Notes (Signed)
ROB [redacted]w[redacted]d: She is doing well. She declines flu vaccine. She has no new concerns today.

## 2023-01-27 NOTE — Patient Instructions (Addendum)
Laura [name]  Confirmation of pregnancy and ultrasound ordered if medically indicated-[redacted] weeks gestation New OB (NOB) intake with nurse and New OB (NOB) labs- [redacted] weeks gestation New OB (NOB) physical examination with provider- 11/[redacted] weeks gestation Flu vaccine-[redacted] weeks gestation Anatomy scan-[redacted] weeks gestation Glucose tolerance test, blood work to test for anemia, T-dap vaccine-[redacted] weeks gestation Vaginal swabs/cultures-STD/Group B strep-[redacted] weeks gestation Appointments every 4 weeks until 28 weeks Every 2 weeks from 28 weeks until 36 weeks Weekly visits from 36 weeks until delivery       Common Medications Safe in Pregnancy  Acne:      Constipation:  Benzoyl Peroxide     Colace  Clindamycin      Dulcolax Suppository  Topica Erythromycin     Fibercon  Salicylic Acid      Metamucil         Miralax AVOID:        Senakot   Accutane    Cough:  Retin-A       Cough Drops  Tetracycline      Phenergan w/ Codeine if Rx  Minocycline      Robitussin (Plain & DM)  Antibiotics:     Crabs/Lice:  Ceclor       RID  Cephalosporins    AVOID:  E-Mycins      Kwell  Keflex  Macrobid/Macrodantin   Diarrhea:  Penicillin      Kao-Pectate  Zithromax      Imodium AD         PUSH FLUIDS AVOID:       Cipro     Fever:  Tetracycline      Tylenol (Regular or Extra  Minocycline       Strength)  Levaquin      Extra Strength-Do not          Exceed 8 tabs/24 hrs Caffeine:        200mg /day (equiv. To 1 cup of coffee or  approx. 3 12 oz sodas)         Gas: Cold/Hayfever:       Gas-X  Benadryl      Mylicon  Claritin       Phazyme  **Claritin-D        Chlor-Trimeton    Headaches:  Dimetapp      ASA-Free Excedrin  Drixoral-Non-Drowsy     Cold Compress  Mucinex (Guaifenasin)     Tylenol (Regular or Extra  Sudafed/Sudafed-12 Hour     Strength)  **Sudafed PE Pseudoephedrine   Tylenol Cold & Sinus     Vicks Vapor Rub  Zyrtec  **AVOID if Problems With Blood  Pressure         Heartburn: Avoid lying down for at least 1 hour after meals  Aciphex      Maalox     Rash:  Milk of Magnesia     Benadryl    Mylanta       1% Hydrocortisone Cream  Pepcid  Pepcid Complete   Sleep Aids:  Prevacid      Ambien   Prilosec       Benadryl  Rolaids       Chamomile Tea  Tums (Limit 4/day)     Unisom         Tylenol PM         Warm milk-add vanilla or  Hemorrhoids:       Sugar for taste  Anusol/Anusol H.C.  (RX: Analapram 2.5%)  Sugar Substitutes:  Hydrocortisone  OTC     Ok in moderation  Preparation H      Tucks        Vaseline lotion applied to tissue with wiping    Herpes:     Throat:  Acyclovir      Oragel  Famvir  Valtrex     Vaccines:         Flu Shot Leg Cramps:       *Gardasil  Benadryl      Hepatitis A         Hepatitis B Nasal Spray:       Pneumovax  Saline Nasal Spray     Polio Booster         Tetanus Nausea:       Tuberculosis test or PPD  Vitamin B6 25 mg TID   AVOID:    Dramamine      *Gardasil  Emetrol       Live Poliovirus  Ginger Root 250 mg QID    MMR (measles, mumps &  High Complex Carbs @ Bedtime    rebella)  Sea Bands-Accupressure    Varicella (Chickenpox)  Unisom 1/2 tab TID     *No known complications           If received before Pain:         Known pregnancy;   Darvocet       Resume series after  Lortab        Delivery  Percocet    Yeast:   Tramadol      Femstat  Tylenol 3      Gyne-lotrimin  Ultram       Monistat  Vicodin           MISC:         All Sunscreens           Hair Coloring/highlights          Insect Repellant's          (Including DEET)         Mystic Tans        Morning Sickness  Morning sickness is when you feel like you may vomit (feel nauseous) during pregnancy. Sometimes, you may vomit. Morning sickness most often happens in the morning, but it can also happen at any time of the day. Some women may have morning sickness that makes them vomit all the time. This is a more serious  problem that needs treatment. Laura are the causes? The cause of this condition is not known. Laura increases the risk? You had vomiting or a feeling like you may vomit before your pregnancy. You had morning sickness in another pregnancy. You are pregnant with more than one baby, such as twins. Laura are the signs or symptoms? Feeling like you may vomit. Vomiting. How is this treated? Treatment is usually not needed for this condition. You may only need to change Laura you eat. In some cases, your doctor may give you some things to take for your condition. These include: Vitamin B6 supplements. Medicines to treat the feeling that you may vomit. Ginger. Follow these instructions at home: Medicines Take over-the-counter and prescription medicines only as told by your doctor. Do not take any medicines until you talk with your doctor about them first. Take multivitamins before you get pregnant. These can stop or lessen the symptoms of morning sickness. Eating and drinking Eat dry toast or crackers before getting out of bed. Eat 5 or 6 small meals a day.  Eat dry and bland foods like rice and baked potatoes. Do not eat greasy, fatty, or spicy foods. Have someone cook for you if the smell of food causes you to vomit or to feel like you may vomit. If you feel like you may vomit after taking prenatal vitamins, take them at night or with a snack. Eat protein foods when you need a snack. Nuts, yogurt, and cheese are good choices. Drink fluids throughout the day. Try ginger ale made with real ginger, ginger tea made from fresh grated ginger, or ginger candies. General instructions Do not smoke or use any products that contain nicotine or tobacco. If you need help quitting, ask your doctor. Use an air purifier to keep the air in your house free of smells. Get lots of fresh air. Try to avoid smells that make you feel sick. Try wearing an acupressure wristband. This is a wristband that is used to  treat seasickness. Try a treatment called acupuncture. In this treatment, a doctor puts needles into certain areas of your body to make you feel better. Contact a doctor if: You need medicine to feel better. You feel dizzy or light-headed. You are losing weight. Get help right away if: The feeling that you may vomit will not go away, or you cannot stop vomiting. You faint. You have very bad pain in your belly. Summary Morning sickness is when you feel like you may vomit (feel nauseous) during pregnancy. You may feel sick in the morning, but you can feel this way at any time of the day. Making some changes to Laura you eat may help your symptoms go away. This information is not intended to replace advice given to you by your health care provider. Make sure you discuss any questions you have with your health care provider. Document Revised: 10/23/2019 Document Reviewed: 10/02/2019 Elsevier Patient Education  2024 [employer]. First Trimester of Pregnancy  The first trimester of pregnancy starts on the first day of your last menstrual period until the end of week 12. This is also called months 1 through 3 of pregnancy. Body changes during your first trimester Your body goes through many changes during pregnancy. The changes usually return to normal after your baby is born. Physical changes You may gain or lose weight. Your breasts may grow larger and hurt. The area around your nipples may get darker. Dark spots or blotches may develop on your face. You may have changes in your hair. Health changes You may feel like you might vomit (nauseous), and you may vomit. You may have heartburn. You may have headaches. You may have trouble pooping (constipation). Your gums may bleed. Other changes You may get tired easily. You may pee (urinate) more often. Your menstrual periods will stop. You may not feel hungry. You may want to eat certain kinds of food. You may have changes in your  emotions from day to day. You may have more dreams. Follow these instructions at home: Medicines Take over-the-counter and prescription medicines only as told by your doctor. Some medicines are not safe during pregnancy. Take a prenatal vitamin that contains at least 600 micrograms (mcg) of folic acid. Eating and drinking Eat healthy meals that include: Fresh fruits and vegetables. Whole grains. Good sources of protein, such as meat, eggs, or tofu. Low-fat dairy products. Avoid raw meat and unpasteurized juice, milk, and cheese. If you feel like you may vomit, or you vomit: Eat 4 or 5 small meals a day instead of 3 large meals.  Try eating a few soda crackers. Drink liquids between meals instead of during meals. You may need to take these actions to prevent or treat trouble pooping: Drink enough fluids to keep your pee (urine) pale yellow. Eat foods that are high in fiber. These include beans, whole grains, and fresh fruits and vegetables. Limit foods that are high in fat and sugar. These include fried or sweet foods. Activity Exercise only as told by your doctor. Most people can do their usual exercise routine during pregnancy. Stop exercising if you have cramps or pain in your lower belly (abdomen) or low back. Do not exercise if it is too hot or too humid, or if you are in a place of great height (high altitude). Avoid heavy lifting. If you choose to, you may have sex unless your doctor tells you not to. Relieving pain and discomfort Wear a good support bra if your breasts are sore. Rest with your legs raised (elevated) if you have leg cramps or low back pain. If you have bulging veins (varicose veins) in your legs: Wear support hose as told by your doctor. Raise your feet for 15 minutes, 3-4 times a day. Limit salt in your food. Safety Wear your seat belt at all times when you are in a car. Talk with your doctor if someone is hurting you or yelling at you. Talk with your  doctor if you are feeling sad or have thoughts of hurting yourself. Lifestyle Do not use hot tubs, steam rooms, or saunas. Do not douche. Do not use tampons or scented sanitary pads. Do not use herbal medicines, illegal drugs, or medicines that are not approved by your doctor. Do not drink alcohol. Do not smoke or use any products that contain nicotine or tobacco. If you need help quitting, ask your doctor. Avoid cat litter boxes and soil that is used by cats. These carry germs that can cause harm to the baby and can cause a loss of your baby by miscarriage or stillbirth. General instructions Keep all follow-up visits. This is important. Ask for help if you need counseling or if you need help with nutrition. Your doctor can give you advice or tell you where to go for help. Visit your dentist. At home, brush your teeth with a soft toothbrush. Floss gently. Write down your questions. Take them to your prenatal visits. Where to find more information American Pregnancy Association: americanpregnancy.org [employer]: www.acog.org Office on Women's Health: [employer] Contact a doctor if: You are dizzy. You have a fever. You have mild cramps or pressure in your lower belly. You have a nagging pain in your belly area. You continue to feel like you may vomit, you vomit, or you have watery poop (diarrhea) for 24 hours or longer. You have a bad-smelling fluid coming from your vagina. You have pain when you pee. You are exposed to a disease that spreads from person to person, such as chickenpox, measles, Zika virus, HIV, or hepatitis. Get help right away if: You have spotting or bleeding from your vagina. You have very bad belly cramping or pain. You have shortness of breath or chest pain. You have any kind of injury, such as from a fall or a car crash. You have new or increased pain, swelling, or redness in an arm or leg. Summary The first  trimester of pregnancy starts on the first day of your last menstrual period until the end of week 12 (months 1 through 3). Eat 4 or  5 small meals a day instead of 3 large meals. Do not smoke or use any products that contain nicotine or tobacco. If you need help quitting, ask your doctor. Keep all follow-up visits. This information is not intended to replace advice given to you by your health care provider. Make sure you discuss any questions you have with your health care provider. Document Revised: 08/16/2019 Document Reviewed: 06/22/2019 Elsevier Patient Education  2024 [employer] Inc[employer]    Commonly Asked Questions During Pregnancy  Cats: A parasite can be excreted in cat feces.  To avoid exposure you need to have another person empty the little box.  If you must empty the litter box you will need to wear gloves.  Wash your hands after handling your cat.  This parasite can also be found in raw or undercooked meat so this should also be avoided.  Colds, Sore Throats, Flu: Please check your medication sheet to see Laura you can take for symptoms.  If your symptoms are unrelieved by these medications please call the office.  Dental Work: Most any dental work Agricultural consultant recommends is permitted.  X-rays should only be taken during the first trimester if absolutely necessary.  Your abdomen should be shielded with a lead apron during all x-rays.  Please notify your provider prior to receiving any x-rays.  Novocaine is fine; gas is not recommended.  If your dentist requires a note from [place] prior to dental work please call the office and we will provide one for you.  Exercise: Exercise is an important part of staying healthy during your pregnancy.  You may continue most exercises you were accustomed to prior to pregnancy.  Later in your pregnancy you will most likely notice you have difficulty with activities requiring balance like riding a bicycle.  It is important that you listen to your body and avoid  activities that put you at a higher risk of falling.  Adequate rest and staying well hydrated are a must!  If you have questions about the safety of specific activities ask your provider.    Exposure to Children with illness: Try to avoid obvious exposure; report any symptoms to [place] when noted,  If you have chicken pos, red measles or mumps, you should be immune to these diseases.   Please do not take any vaccines while pregnant unless you have checked with your OB provider.  Fetal Movement: After 28 weeks we recommend you do "kick counts" twice daily.  Lie or sit down in a calm quiet environment and count your baby movements "kicks".  You should feel your baby at least 10 times per hour.  If you have not felt 10 kicks within the first hour get up, walk around and have something sweet to eat or drink then repeat for an additional hour.  If count remains less than 10 per hour notify your provider.  Fumigating: Follow your pest control agent's advice as to how long to stay out of your home.  Ventilate the area well before re-entering.  Hemorrhoids:   Most over-the-counter preparations can be used during pregnancy.  Check your medication to see Laura is safe to use.  It is important to use a stool softener or fiber in your diet and to drink lots of liquids.  If hemorrhoids seem to be getting worse please call the office.   Hot Tubs:  Hot tubs Jacuzzis and saunas are not recommended while pregnant.  These increase your internal body temperature and should be avoided.  Intercourse:  Sexual intercourse is safe during pregnancy as long as you are comfortable, unless otherwise advised by your provider.  Spotting may occur after intercourse; report any bright red bleeding that is heavier than spotting.  Labor:  If you know that you are in labor, please go to the hospital.  If you are unsure, please call the office and let us help you decide Laura to do.  Lifting, straining, etc:  If your job requires heavy  lifting or straining please check with your provider for any limitations.  Generally, you should not lift items heavier than that you can lift simply with your hands and arms (no back muscles)  Painting:  Paint fumes do not harm your pregnancy, but may make you ill and should be avoided if possible.  Latex or water based paints have less odor than oils.  Use adequate ventilation while painting.  Permanents & Hair Color:  Chemicals in hair dyes are not recommended as they cause increase hair dryness which can increase hair loss during pregnancy.  " Highlighting" and permanents are allowed.  Dye may be absorbed differently and permanents may not hold as well during pregnancy.  Sunbathing:  Use a sunscreen, as skin burns easily during pregnancy.  Drink plenty of fluids; avoid over heating.  Tanning Beds:  Because their possible side effects are still unknown, tanning beds are not recommended.  Ultrasound Scans:  Routine ultrasounds are performed at approximately 20 weeks.  You will be able to see your baby's general anatomy an if you would like to know the gender this can usually be determined as well.  If it is questionable when you conceived you may also receive an ultrasound early in your pregnancy for dating purposes.  Otherwise ultrasound exams are not routinely performed unless there is a medical necessity.  Although you can request a scan we ask that you pay for it when conducted because insurance does not cover " patient request" scans.  Work: If your pregnancy proceeds without complications you may work until your due date, unless your physician or employer advises otherwise.  Round Ligament Pain/Pelvic Discomfort:  Sharp, shooting pains not associated with bleeding are fairly common, usually occurring in the second trimester of pregnancy.  They tend to be worse when standing up or when you remain standing for long periods of time.  These are the result of pressure of certain pelvic ligaments  called "round ligaments".  Rest, Tylenol and heat seem to be the most effective relief.  As the womb and fetus grow, they rise out of the pelvis and the discomfort improves.  Please notify the office if your pain seems different than that described.  It may represent a more serious condition.
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
## 2023-01-28 LAB — URINALYSIS, ROUTINE W REFLEX MICROSCOPIC
Bilirubin, UA: NEGATIVE
Glucose, UA: NEGATIVE
Ketones, UA: NEGATIVE
Leukocytes,UA: NEGATIVE
Nitrite, UA: NEGATIVE
Protein,UA: NEGATIVE
RBC, UA: NEGATIVE
Specific Gravity, UA: >=1.03 — AB (ref 1.005–1.030)
Urobilinogen, Ur: 1 mg/dL (ref 0.2–1.0)
pH, UA: 5.5 (ref 5.0–7.5)

## 2023-01-28 LAB — MONITOR DRUG PROFILE 14(MW)
Amphetamine Scrn, Ur: NEGATIVE ng/mL
BARBITURATE SCREEN URINE: NEGATIVE ng/mL
BENZODIAZEPINE SCREEN, URINE: NEGATIVE ng/mL
Buprenorphine, Urine: NEGATIVE ng/mL
CANNABINOIDS UR QL SCN: NEGATIVE ng/mL
Cocaine (Metab) Scrn, Ur: NEGATIVE ng/mL
Creatinine(Crt), U: 229.9 mg/dL (ref 20.0–300.0)
Fentanyl, Urine: NEGATIVE pg/mL
Meperidine Screen, Urine: NEGATIVE ng/mL
Methadone Screen, Urine: NEGATIVE ng/mL
OXYCODONE+OXYMORPHONE UR QL SCN: NEGATIVE ng/mL
Opiate Scrn, Ur: NEGATIVE ng/mL
Ph of Urine: 5.1 (ref 4.5–8.9)
Phencyclidine Qn, Ur: NEGATIVE ng/mL
Propoxyphene Scrn, Ur: NEGATIVE ng/mL
SPECIFIC GRAVITY: 1.032
Tramadol Screen, Urine: NEGATIVE ng/mL

## 2023-01-29 LAB — CBC/D/PLT+RPR+RH+ABO+RUBIGG...
Antibody Screen: NEGATIVE
Basophils Absolute: 0 10*3/uL (ref 0.0–0.2)
Basos: 0 %
EOS (ABSOLUTE): 0.1 10*3/uL (ref 0.0–0.4)
Eos: 1 %
HCV Ab: NONREACTIVE
HIV Screen 4th Generation wRfx: NONREACTIVE
Hematocrit: 40.1 % (ref 34.0–46.6)
Hemoglobin: 13.3 g/dL (ref 11.1–15.9)
Hepatitis B Surface Ag: NEGATIVE
Immature Grans (Abs): 0 10*3/uL (ref 0.0–0.1)
Immature Granulocytes: 0 %
Lymphocytes Absolute: 1.7 10*3/uL (ref 0.7–3.1)
Lymphs: 29 %
MCH: 28.4 pg (ref 26.6–33.0)
MCHC: 33.2 g/dL (ref 31.5–35.7)
MCV: 86 fL (ref 79–97)
Monocytes Absolute: 0.2 10*3/uL (ref 0.1–0.9)
Monocytes: 4 %
Neutrophils Absolute: 4 10*3/uL (ref 1.4–7.0)
Neutrophils: 66 %
Platelets: 224 10*3/uL (ref 150–450)
RBC: 4.68 x10E6/uL (ref 3.77–5.28)
RDW: 13.1 % (ref 11.7–15.4)
RPR Ser Ql: NONREACTIVE
Rh Factor: POSITIVE
Rubella Antibodies, IGG: 3.77 {index} (ref >0.99)
Varicella zoster IgG: REACTIVE
WBC: 6.1 10*3/uL (ref 3.4–10.8)

## 2023-01-29 LAB — HGB FRACTIONATION CASCADE
Hgb A2: 2.5 % (ref 1.8–3.2)
Hgb A: 97.5 % (ref 96.4–98.8)
Hgb F: 0 % (ref 0.0–2.0)
Hgb S: 0 %

## 2023-01-29 LAB — CULTURE, OB URINE

## 2023-01-29 LAB — TSH+FREE T4
Free T4: 1.16 ng/dL (ref 0.82–1.77)
TSH: 1.71 u[IU]/mL (ref 0.450–4.500)

## 2023-01-29 LAB — HCV INTERPRETATION

## 2023-01-29 LAB — URINE CULTURE, OB REFLEX

## 2023-02-05 LAB — PANORAMA PRENATAL TEST FULL PANEL:PANORAMA TEST PLUS 5 ADDITIONAL MICRODELETIONS: FETAL FRACTION: 4

## 2023-02-05 LAB — CYTOLOGY - PAP
Chlamydia: NEGATIVE
Comment: NEGATIVE
Comment: NEGATIVE
Comment: NORMAL
Diagnosis: NEGATIVE
Diagnosis: REACTIVE
High risk HPV: NEGATIVE
Neisseria Gonorrhea: NEGATIVE

## 2023-02-17 ENCOUNTER — Encounter: Payer: Self-pay | Admitting: Licensed Practical Nurse

## 2023-02-17 ENCOUNTER — Telehealth: Payer: Self-pay

## 2023-02-17 ENCOUNTER — Ambulatory Visit: Payer: Managed Care, Other (non HMO) | Admitting: Licensed Practical Nurse

## 2023-02-17 VITALS — BP 114/78 | HR 84 | Wt 209.4 lb

## 2023-02-17 DIAGNOSIS — Z3A15 15 weeks gestation of pregnancy: Secondary | ICD-10-CM

## 2023-02-17 DIAGNOSIS — Z3480 Encounter for supervision of other normal pregnancy, unspecified trimester: Secondary | ICD-10-CM

## 2023-02-17 DIAGNOSIS — Z3482 Encounter for supervision of other normal pregnancy, second trimester: Secondary | ICD-10-CM

## 2023-02-17 LAB — POCT URINALYSIS DIPSTICK
Bilirubin, UA: NEGATIVE
Glucose, UA: NEGATIVE
Ketones, UA: POSITIVE
Leukocytes, UA: NEGATIVE
Nitrite, UA: NEGATIVE
Protein, UA: NEGATIVE
Spec Grav, UA: 1.02 (ref 1.010–1.025)
Urobilinogen, UA: 0.2 U/dL
pH, UA: 7 (ref 5.0–8.0)
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
## 2023-02-17 NOTE — Telephone Encounter (Signed)
Contacted pt.  Phone went right to voice mail two times.  Left message for pt to call back to schedule for today.  LMD has 1:55 available to day.

## 2023-02-20 LAB — URINE CULTURE

## 2023-02-24 ENCOUNTER — Other Ambulatory Visit: Payer: Managed Care, Other (non HMO)

## 2023-02-24 ENCOUNTER — Ambulatory Visit (INDEPENDENT_AMBULATORY_CARE_PROVIDER_SITE_OTHER): Payer: Managed Care, Other (non HMO)

## 2023-02-24 VITALS — BP 113/71 | HR 88 | Wt 216.0 lb

## 2023-02-24 DIAGNOSIS — O99212 Obesity complicating pregnancy, second trimester: Secondary | ICD-10-CM

## 2023-02-24 DIAGNOSIS — Z3480 Encounter for supervision of other normal pregnancy, unspecified trimester: Secondary | ICD-10-CM

## 2023-02-24 DIAGNOSIS — Z3A16 16 weeks gestation of pregnancy: Secondary | ICD-10-CM

## 2023-02-24 DIAGNOSIS — E669 Obesity, unspecified: Secondary | ICD-10-CM

## 2023-02-24 DIAGNOSIS — Z8632 Personal history of gestational diabetes: Secondary | ICD-10-CM

## 2023-02-24 DIAGNOSIS — O99211 Obesity complicating pregnancy, first trimester: Secondary | ICD-10-CM
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
## 2023-02-24 NOTE — Assessment & Plan Note (Addendum)
-   Early one hour glucola today.  - Baseline PIH labs also collected today.
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
## 2023-02-25 ENCOUNTER — Other Ambulatory Visit: Payer: Self-pay

## 2023-02-25 LAB — COMPREHENSIVE METABOLIC PANEL
ALT: 18 [IU]/L (ref 0–32)
AST: 11 [IU]/L (ref 0–40)
Albumin: 3.6 g/dL — ABNORMAL LOW (ref 3.9–4.9)
Alkaline Phosphatase: 50 [IU]/L (ref 44–121)
BUN/Creatinine Ratio: 11 (ref 9–23)
BUN: 6 mg/dL (ref 6–20)
Bilirubin Total: 0.2 mg/dL (ref 0.0–1.2)
CO2: 19 mmol/L — ABNORMAL LOW (ref 20–29)
Calcium: 9.3 mg/dL (ref 8.7–10.2)
Chloride: 102 mmol/L (ref 96–106)
Creatinine, Ser: 0.56 mg/dL — ABNORMAL LOW (ref 0.57–1.00)
Globulin, Total: 2.5 g/dL (ref 1.5–4.5)
Glucose: 209 mg/dL — ABNORMAL HIGH (ref 70–99)
Potassium: 3.8 mmol/L (ref 3.5–5.2)
Sodium: 137 mmol/L (ref 134–144)
Total Protein: 6.1 g/dL (ref 6.0–8.5)
eGFR: 123 mL/min/{1.73_m2} (ref >59)

## 2023-02-25 LAB — GLUCOSE, 1 HOUR GESTATIONAL: Gestational Diabetes Screen: 207 mg/dL — ABNORMAL HIGH (ref 70–139)

## 2023-02-26 LAB — PROTEIN / CREATININE RATIO, URINE
Creatinine, Urine: 130.2 mg/dL
Protein, Ur: 9.7 mg/dL
Protein/Creat Ratio: 75 mg/g{creat} (ref 0–200)

## 2023-03-03 ENCOUNTER — Telehealth: Payer: Self-pay

## 2023-03-03 ENCOUNTER — Encounter: Payer: Self-pay | Admitting: Dietician

## 2023-03-03 ENCOUNTER — Encounter: Payer: Managed Care, Other (non HMO) | Attending: Obstetrics and Gynecology | Admitting: Dietician

## 2023-03-03 DIAGNOSIS — Z713 Dietary counseling and surveillance: Secondary | ICD-10-CM | POA: Diagnosis not present

## 2023-03-03 DIAGNOSIS — O24419 Gestational diabetes mellitus in pregnancy, unspecified control: Secondary | ICD-10-CM | POA: Insufficient documentation

## 2023-03-03 DIAGNOSIS — Z3A17 17 weeks gestation of pregnancy: Secondary | ICD-10-CM | POA: Diagnosis not present

## 2023-03-03 DIAGNOSIS — E119 Type 2 diabetes mellitus without complications: Secondary | ICD-10-CM

## 2023-03-03 MED ORDER — BLOOD GLUCOSE MONITORING SUPPL DEVI: 1.0000 | Freq: Three times a day (TID) | 0 refills | Status: AC

## 2023-03-03 MED ORDER — BLOOD GLUCOSE TEST VI STRP: 1.0000 | ORAL_STRIP | Freq: Three times a day (TID) | 0 refills | Status: DC

## 2023-03-03 MED ORDER — LANCETS MISC. MISC: 1.0000 | Freq: Three times a day (TID) | 0 refills | Status: DC

## 2023-03-03 MED ORDER — LANCET DEVICE MISC: 1.0000 | Freq: Three times a day (TID) | 0 refills | Status: AC

## 2023-03-03 NOTE — Telephone Encounter (Signed)
Pt called triage and stated she had just left her gestational diabetes class and was advised to contact our office to get a prescription for strips and lancets for her AccuChek glucometer. Can you pls help?

## 2023-03-03 NOTE — Progress Notes (Signed)
Patient was seen for Gestational Diabetes self-management on 03/03/2023  Start time 1410 and End time 1530   Estimated due date: 06/11/2023; [redacted]w[redacted]d   Clinical: Medications: Prenatal MVI Medical History: GDM Labs: OGTT 207 mg/dL @1  hr,    Dietary and Lifestyle History: Pt reports previous diabetes education in 2018 for GDM when having their second child, would like a refresher. Pt reports eating 3 meals and a snack between lunch and dinner because those meals are usually 7-8 hours apart (12:00 - 7:00/8:00 PM). Pt reports food insecurity at times, is applying for food stamps for themselves and 2 sons. Pt reports being busy with work and kids have sports in the evening, makes it difficult to be active. Pt states they are short staffed at work, which makes it stressful.  Physical Activity: None Stress: 6/10, Mostly related to work Sleep: Decent, waking up at night more often in second trimester  24 hr Recall:  First Meal: Breakfast burrito. Snack: Second meal: Beef stroganoff, Noodles, Water Snack: Third meal: BK Chicken sandwich, Donzetta Sprung, Sweet tea Snack: Beverages: Water, Sweet tea  NUTRITION INTERVENTION  Nutrition education (E-1) on the following topics:   Initial Follow-up  [x]  []  Definition of Gestational Diabetes [x]  []  Why dietary management is important in controlling blood glucose [x]  []  Effects each nutrient has on blood glucose levels [x]  []  Simple carbohydrates vs complex carbohydrates [x]  []  Fluid intake [x]  []  Creating a balanced meal plan [x]  []  Carbohydrate counting  [x]  []  When to check blood glucose levels [x]  []  Proper blood glucose monitoring techniques [x]  []  Effect of stress and stress reduction techniques  [x]  []  Exercise effect on blood glucose levels, appropriate exercise during pregnancy [x]  []  Importance of limiting caffeine and abstaining from alcohol and smoking [x]  []  Medications used for blood sugar control during pregnancy [x]  []  Hypoglycemia  and rule of 15 [x]  []  Postpartum self care  Blood glucose monitor given: Filbert Berthold Lot # [id number] S/N: [id number] Exp: 10/09/2023 CBG: 76 mg/dL   Patient instructed to monitor glucose levels: FBS: 60 - <= 95 mg/dL; 2 hour: <= 981 mg/dL  Patient received handouts: Nutrition Diabetes and Pregnancy Snack Sheet Blood glucose log Food Assistance Resources  Patient will be seen for follow-up as needed.

## 2023-03-03 NOTE — Patient Instructions (Addendum)
Have your A1c checked at your next physical and annually after that!  Go to the store and purchase aspirin this evening. Take 81 mg once daily as recommended!!  Contact your OB to get a prescription for strips and lancets for your AccuChek glucometer.

## 2023-03-03 NOTE — Telephone Encounter (Signed)
Rx sent, called pt, no answer, left detailed msg Rx sent and to let us know if she has any issues.

## 2023-03-18 ENCOUNTER — Other Ambulatory Visit: Payer: Managed Care, Other (non HMO)

## 2023-03-19 ENCOUNTER — Ambulatory Visit
Admission: RE | Admit: 2023-03-19 | Discharge: 2023-03-19 | Disposition: A | Discharge status: Home / Self Care | Payer: Managed Care, Other (non HMO) | Source: Ambulatory Visit | Attending: Licensed Practical Nurse | Admitting: Licensed Practical Nurse

## 2023-03-19 DIAGNOSIS — Z3482 Encounter for supervision of other normal pregnancy, second trimester: Secondary | ICD-10-CM | POA: Insufficient documentation

## 2023-03-19 DIAGNOSIS — Z3A20 20 weeks gestation of pregnancy: Secondary | ICD-10-CM | POA: Diagnosis not present

## 2023-03-19 DIAGNOSIS — Z3480 Encounter for supervision of other normal pregnancy, unspecified trimester: Secondary | ICD-10-CM | POA: Diagnosis present

## 2023-03-19 DIAGNOSIS — Z3A15 15 weeks gestation of pregnancy: Secondary | ICD-10-CM | POA: Insufficient documentation

## 2023-03-19 NOTE — Telephone Encounter (Signed)
Pt was seen on 02/17/2023 with LMD.

## 2023-03-24 NOTE — L&D Delivery Note (Signed)
 Obstetrical Delivery Note   Date of Delivery:   07/29/2023 Primary OB:   AOB Gestational Age/EDD: [redacted]w[redacted]d (Dated by LMP) Reason for Admission: IOL secondary to T2DM Antepartum complications: T2DM, obesity   Delivered By:   Kennth Peal CNM and Germaine Kohut CNM   Delivery Type:   spontaneous vaginal delivery  Delivery Details:   Pt arrived 3/thick/-3, she received one dose of Cytotec  and then pitocin .She got a labor epidural. SROM for copious amounts of fluid at 2026. She was found to be complete and -1 station at 2223. She began pushing shortly after that. While reclined back with her legs supported she had a SVB of viable female at 2258.  Infant birthed to the chin then head fully coaxed, no restitution noted. This cnm gently restituted infant to LOA followed by no movement of shoulders, fetus rotated back to OA, this cnm attempted to restitute infant to ROA but unable too. Infant then rotated to LOA again, maternal legs released and then put back into [place], no movement of anterior shoulder noted, Attempted to sweep posterior arm unable to reach arm, attempted to rotate shoulders, able to rotate some but still no movement of  shoulders noted, call out for assistance Elsworth Halt CNM into assist see note. Infant birthed and placed on maternal abdomen. Some crying with stimulation HR >100 with some tone. Cord doubly clamped and cut, infant taken to warmer for assessment. Placenta delivered with maternal effort. On inspections of perineum a small vaginal  and periurethral lacerations were found and repaired with Vicryl suture. Left labia with small abrasion but hemostatic. Large clot released with fundal massage after repair. Cytotec  placed PR prophylacticaly. Infant skin to skin with mom, both stable.  Anesthesia:    epidural Intrapartum complications: Shoulder Dystocia GBS:    Negative Laceration:    vaginal and periurethral Episiotomy:    none Rectal exam:   Placenta:    Delivered and expressed via  active management. Intact: yes. To pathology: no.  Delayed Cord Clamping: no Estimated Blood Loss:   Baby:    Liveborn female, APGARs 7/8, weight 4640gm  Anice Kerbs, CNM  [facility]  07/30/2023 12:43 AM

## 2023-03-25 ENCOUNTER — Ambulatory Visit: Payer: Managed Care, Other (non HMO) | Admitting: Obstetrics

## 2023-03-25 ENCOUNTER — Encounter: Payer: Self-pay | Admitting: Obstetrics

## 2023-03-25 VITALS — BP 119/79 | HR 92 | Wt 217.0 lb

## 2023-03-25 DIAGNOSIS — Z3A20 20 weeks gestation of pregnancy: Secondary | ICD-10-CM

## 2023-03-25 DIAGNOSIS — Z3480 Encounter for supervision of other normal pregnancy, unspecified trimester: Secondary | ICD-10-CM

## 2023-03-25 DIAGNOSIS — E119 Type 2 diabetes mellitus without complications: Secondary | ICD-10-CM

## 2023-03-25 DIAGNOSIS — O26892 Other specified pregnancy related conditions, second trimester: Secondary | ICD-10-CM

## 2023-03-25 DIAGNOSIS — Z362 Encounter for other antenatal screening follow-up: Secondary | ICD-10-CM

## 2023-03-25 DIAGNOSIS — O24112 Pre-existing diabetes mellitus, type 2, in pregnancy, second trimester: Secondary | ICD-10-CM

## 2023-03-25 MED ORDER — LANCETS MISC. MISC: 1.0000 | Freq: Three times a day (TID) | 0 refills | Status: AC

## 2023-03-25 MED ORDER — BLOOD GLUCOSE TEST VI STRP: 1.0000 | ORAL_STRIP | Freq: Three times a day (TID) | 0 refills | Status: AC

## 2023-03-25 NOTE — Progress Notes (Signed)
    Return Prenatal Note   Assessment/Plan   Plan  33 y.o. G3P2002 at [redacted]w[redacted]d presents for follow-up OB visit. Reviewed prenatal record including previous visit note.  Type 2 diabetes mellitus (HCC) -Nutrition consult completed 03/03/23 -Rx re-sent for lancets -F/u in 2 weeks to review BG log  Supervision of other normal pregnancy, antepartum -F/u US  ordered for incomplete anatomy -Anticipatory guidance about 2nd trimester and fetal development   Orders Placed This Encounter  Procedures   US  OB Follow Up    Standing Status:   Future    Expected Date:   04/25/2023    Expiration Date:   03/24/2024    Reason for Exam (SYMPTOM  OR DIAGNOSIS REQUIRED):   incomplete anatomy    Preferred Imaging Location?:   Internal   No follow-ups on file.   No future appointments.  For next visit:   video visit for blood sugar review, then ROB    Subjective   Laura [name] has been having pain in her buttocks when going from standing to sitting. She is also having headaches at night that resolve with Tylenol . She has been the dietician but has not been able to check her blood sugars because she does not have lancets and test strips.  Movement: Present Contractions: Not present  Objective   Flow sheet Vitals: Pulse Rate: 92 BP: 119/79 Fundal Height: 20 cm Fetal Heart Rate (bpm): 145 Total weight gain: 17 lb (7.711 kg)  General Appearance  No acute distress, well appearing, and well nourished Pulmonary   Normal work of breathing Neurologic   Alert and oriented to person, place, and time Psychiatric   Mood and affect within normal limits  Eleanor Canny, CNM 03/25/23 1:36 PM

## 2023-03-25 NOTE — Assessment & Plan Note (Signed)
-  Nutrition consult completed 03/03/23 -Rx re-sent for lancets -F/u in 2 weeks to review BG log

## 2023-03-25 NOTE — Assessment & Plan Note (Signed)
-  F/u US ordered for incomplete anatomy -Anticipatory guidance about 2nd trimester and fetal development

## 2023-04-09 ENCOUNTER — Telehealth: Payer: Managed Care, Other (non HMO) | Admitting: Obstetrics

## 2023-04-15 ENCOUNTER — Telehealth: Payer: Managed Care, Other (non HMO) | Admitting: Certified Nurse Midwife

## 2023-04-15 ENCOUNTER — Telehealth: Payer: Self-pay | Admitting: Certified Nurse Midwife

## 2023-04-15 NOTE — Telephone Encounter (Signed)
TC to patient due to no log in for virtual visit to review blood sugars. Straight to VM, LVM requesting she call clinic to reschedule.

## 2023-04-15 NOTE — Telephone Encounter (Signed)
Reached out to pt to reschedule MyChart video visit that was scheduled on 1/23/025 at 3:15 with J. Keitha Butte.  Left message for pt to call back.

## 2023-04-16 ENCOUNTER — Encounter: Payer: Self-pay | Admitting: Certified Nurse Midwife

## 2023-04-16 NOTE — Telephone Encounter (Signed)
Reached out to pt (2x) to reschedule MyChart video visit  that was scheduled on 04/15/2023 at 3:15 with J. Keitha Butte.  Left message for pt to call back.  Will send a MyChart letter.

## 2023-04-22 ENCOUNTER — Ambulatory Visit (INDEPENDENT_AMBULATORY_CARE_PROVIDER_SITE_OTHER): Payer: Managed Care, Other (non HMO) | Admitting: Obstetrics

## 2023-04-22 ENCOUNTER — Encounter: Payer: Self-pay | Admitting: Obstetrics

## 2023-04-22 ENCOUNTER — Other Ambulatory Visit: Payer: Managed Care, Other (non HMO)

## 2023-04-22 VITALS — BP 117/76 | HR 81 | Wt 221.0 lb

## 2023-04-22 DIAGNOSIS — Z8632 Personal history of gestational diabetes: Secondary | ICD-10-CM

## 2023-04-22 DIAGNOSIS — E669 Obesity, unspecified: Secondary | ICD-10-CM | POA: Diagnosis not present

## 2023-04-22 DIAGNOSIS — O99211 Obesity complicating pregnancy, first trimester: Secondary | ICD-10-CM

## 2023-04-22 DIAGNOSIS — Z3A24 24 weeks gestation of pregnancy: Secondary | ICD-10-CM

## 2023-04-22 DIAGNOSIS — O24112 Pre-existing diabetes mellitus, type 2, in pregnancy, second trimester: Secondary | ICD-10-CM | POA: Diagnosis not present

## 2023-04-22 DIAGNOSIS — O99212 Obesity complicating pregnancy, second trimester: Secondary | ICD-10-CM | POA: Diagnosis not present

## 2023-04-22 DIAGNOSIS — E119 Type 2 diabetes mellitus without complications: Secondary | ICD-10-CM | POA: Diagnosis not present

## 2023-04-22 DIAGNOSIS — Z3480 Encounter for supervision of other normal pregnancy, unspecified trimester: Secondary | ICD-10-CM

## 2023-04-22 DIAGNOSIS — O09299 Supervision of pregnancy with other poor reproductive or obstetric history, unspecified trimester: Secondary | ICD-10-CM

## 2023-04-22 NOTE — Assessment & Plan Note (Signed)
Reviewed norms of pelvic discomfort during pregnancy. Recommended pelvic PT either during pregnancy or afterwards for pelvic floor strengthening.

## 2023-04-22 NOTE — Assessment & Plan Note (Signed)
Missed follow up anatomy appointment today but has rescheduled for 04/27/2023

## 2023-04-22 NOTE — Assessment & Plan Note (Signed)
Diagnosed with early 1-hour with finding >200. Was not able to bring log today. Remembers fasting value of 179 yesterday. Typically has values between 115-120 fasting and 145-155 postprandial. Has been able to begin eating at home instead of fast food. Tries to eat a protein, vegetable and carb source each meal with popcorn as snack. Drinks only water throughout the day. Does not typically eat sweet food. Laura [name] motivated to manage diabetes for her baby.  Will plan on meeting with MD in one week with complete log including diet.

## 2023-04-22 NOTE — Progress Notes (Signed)
    Return Prenatal Note   Subjective   33 y.o. Laura [name] at [redacted]w[redacted]d presents for this follow-up prenatal visit.  Patient some increased pelvic pressure and discomfort Patient reports: Contractions: Not present  Objective   Flow sheet Vitals: Pulse Rate: 81 BP: 117/76 Fundal Height: 24 cm Fetal Heart Rate (bpm): 142 Total weight gain: 21 lb (9.526 kg)  General Appearance  No acute distress, well appearing, and well nourished Pulmonary   Normal work of breathing Neurologic   Alert and oriented to person, place, and time Psychiatric   Mood and affect within normal limits  Assessment/Plan   Plan  33 y.o. [id number] at [redacted]w[redacted]d presents for follow-up OB visit. Reviewed prenatal record including previous visit note.  Type 2 diabetes mellitus (HCC) Diagnosed with early 1-hour with finding >200. Was not able to bring log today. Remembers fasting value of 179 yesterday. Typically has values between 115-120 fasting and 145-155 postprandial. Has been able to begin eating at home instead of fast food. Tries to eat a protein, vegetable and carb source each meal with popcorn as snack. Drinks only water throughout the day. Does not typically eat sweet food. Laura [name] motivated to manage diabetes for her baby.  Will plan on meeting with MD in one week with complete log including diet.   Obesity affecting pregnancy in first trimester Missed follow up anatomy appointment today but has rescheduled for 04/27/2023  Supervision of other normal pregnancy, antepartum Reviewed norms of pelvic discomfort during pregnancy. Recommended pelvic PT either during pregnancy or afterwards for pelvic floor strengthening.      No orders of the defined types were placed in this encounter.  Return in about 1 week (around 04/29/2023).   Future Appointments  Date Time Provider Department Center  04/27/2023 10:30 AM [facility]  04/29/2023  2:55 PM Hildred Laser, MD [facility] None    For next visit:  Visit with MD  for diabetic management     Laura [name], CNM  01/30/254:04 PM

## 2023-04-27 ENCOUNTER — Ambulatory Visit
Admission: RE | Admit: 2023-04-27 | Discharge: 2023-04-27 | Disposition: A | Discharge status: Home / Self Care | Payer: Managed Care, Other (non HMO) | Source: Ambulatory Visit | Attending: Obstetrics | Admitting: Obstetrics

## 2023-04-27 DIAGNOSIS — Z362 Encounter for other antenatal screening follow-up: Secondary | ICD-10-CM | POA: Insufficient documentation

## 2023-04-27 DIAGNOSIS — Z3A24 24 weeks gestation of pregnancy: Secondary | ICD-10-CM | POA: Diagnosis not present

## 2023-04-27 DIAGNOSIS — O321XX0 Maternal care for breech presentation, not applicable or unspecified: Secondary | ICD-10-CM | POA: Diagnosis not present

## 2023-04-29 ENCOUNTER — Ambulatory Visit: Payer: Managed Care, Other (non HMO) | Admitting: Obstetrics and Gynecology

## 2023-04-29 VITALS — BP 107/75 | HR 86 | Wt 223.3 lb

## 2023-04-29 DIAGNOSIS — O09299 Supervision of pregnancy with other poor reproductive or obstetric history, unspecified trimester: Secondary | ICD-10-CM

## 2023-04-29 DIAGNOSIS — Z3A25 25 weeks gestation of pregnancy: Secondary | ICD-10-CM | POA: Diagnosis not present

## 2023-04-29 DIAGNOSIS — Z8632 Personal history of gestational diabetes: Secondary | ICD-10-CM

## 2023-04-29 DIAGNOSIS — O24919 Unspecified diabetes mellitus in pregnancy, unspecified trimester: Secondary | ICD-10-CM | POA: Diagnosis not present

## 2023-04-29 DIAGNOSIS — O0990 Supervision of high risk pregnancy, unspecified, unspecified trimester: Secondary | ICD-10-CM

## 2023-04-29 DIAGNOSIS — O99210 Obesity complicating pregnancy, unspecified trimester: Secondary | ICD-10-CM | POA: Diagnosis not present

## 2023-04-29 LAB — POCT URINALYSIS DIPSTICK OB
Bilirubin, UA: NEGATIVE
Blood, UA: NEGATIVE
Glucose, UA: NEGATIVE
Ketones, UA: NEGATIVE
Leukocytes, UA: NEGATIVE
Nitrite, UA: NEGATIVE
POC,PROTEIN,UA: NEGATIVE
Spec Grav, UA: 1.02 (ref 1.010–1.025)
Urobilinogen, UA: 0.2 U/dL
pH, UA: 6.5 (ref 5.0–8.0)

## 2023-04-29 MED ORDER — GLYBURIDE 2.5 MG PO TABS: 2.5000 mg | ORAL_TABLET | Freq: Two times a day (BID) | ORAL | 3 refills | Status: DC

## 2023-04-29 NOTE — Patient Instructions (Signed)
 Sleep Unisom Benadryl  Chamomille tea/Sleepy tea      Common Medications Safe in Pregnancy  Acne:      Constipation:  Benzoyl Peroxide     Colace  Clindamycin      Dulcolax Suppository  Topica Erythromycin     Fibercon  Salicylic Acid      Metamucil         Miralax AVOID:        Senakot   Accutane    Cough:  Retin-A       Cough Drops  Tetracycline      Phenergan  w/ Codeine  if Rx  Minocycline      Robitussin (Plain & DM)  Antibiotics:     Crabs/Lice:  Ceclor       RID  Cephalosporins    AVOID:  E-Mycins      Kwell  Keflex  Macrobid/Macrodantin   Diarrhea:  Penicillin       Kao-Pectate  Zithromax       Imodium AD         PUSH FLUIDS AVOID:       Cipro     Fever:  Tetracycline      Tylenol  (Regular or Extra  Minocycline       Strength)  Levaquin      Extra Strength-Do not          Exceed 8 tabs/24 hrs Caffeine:        200mg /day (equiv. To 1 cup of coffee or  approx. 3 12 oz sodas)         Gas: Cold/Hayfever:       Gas-X  Benadryl       Mylicon  Claritin       Phazyme  **Claritin-D        Chlor-Trimeton    Headaches:  Dimetapp      ASA-Free Excedrin  Drixoral-Non-Drowsy     Cold Compress  Mucinex  (Guaifenasin)     Tylenol  (Regular or Extra  Sudafed/Sudafed-12 Hour     Strength)  **Sudafed PE Pseudoephedrine   Tylenol  Cold & Sinus     Vicks Vapor Rub  Zyrtec  **AVOID if Problems With Blood Pressure         Heartburn: Avoid lying down for at least 1 hour after meals  Aciphex      Maalox     Rash:  Milk of Magnesia     Benadryl     Mylanta       1% Hydrocortisone  Cream  Pepcid  Pepcid Complete   Sleep Aids:  Prevacid      Ambien    Prilosec       Benadryl   Rolaids       Chamomile Tea  Tums (Limit 4/day)     Unisom         Tylenol  PM         Warm milk-add vanilla or  Hemorrhoids:       Sugar for taste  Anusol /Anusol  H.C.  (RX: Analapram 2.5%)  Sugar Substitutes:  Hydrocortisone  OTC     Ok in moderation  Preparation H      Tucks        Vaseline  lotion applied to tissue with wiping    Herpes:     Throat:  Acyclovir      Oragel  Famvir  Valtrex     Vaccines:         Flu Shot Leg Cramps:       *Gardasil  Benadryl       Hepatitis A  Hepatitis B Nasal Spray:       Pneumovax  Saline Nasal Spray     Polio Booster         Tetanus Nausea:       Tuberculosis test or PPD  Vitamin B6 25 mg TID   AVOID:    Dramamine      *Gardasil  Emetrol       Live Poliovirus  Ginger Root 250 mg QID    MMR (measles, mumps &  High Complex Carbs @ Bedtime    rebella)  Sea Bands-Accupressure    Varicella (Chickenpox)  Unisom 1/2 tab TID     *No known complications           If received before Pain:         Known pregnancy;   Darvocet       Resume series after  Lortab        Delivery  Percocet    Yeast:   Tramadol      Femstat  Tylenol  3      Gyne-lotrimin  Ultram       Monistat  Vicodin           MISC:         All Sunscreens           Hair Coloring/highlights          Insect Repellant's          (Including DEET)         Mystic Tans

## 2023-04-29 NOTE — Progress Notes (Signed)
 ROB [redacted]w[redacted]d: She is doing well. She reports good fetal movement and has no new concerns today.

## 2023-04-29 NOTE — Progress Notes (Signed)
 ROB: Patient is a 33 y.o. G3P2002 at [redacted]w[redacted]d who presents for routine OB care.  Pregnancy is complicated by: Obesity affecting pregnancy in second trimester; Supervision of other normal pregnancy, antepartum; History of gestational diabetes mellitus (GDM); History of macrosomia in infant in prior pregnancy, currently pregnant; and Type 2 diabetes mellitus (HCC) on their problem list.  Patient without major complaints. Reports good fetal movement, denies vaginal bleeding, contractions. Reviewed glucose log, majority of fastings and postprandial elevated. Discussed at this time likely need for medication intervention. Will initiate on Glyburide  2.5 mg BID, and titrate up as needed. Will send next week's blood sugars via Mychart and return in 2 weeks for further glucose review. Now that patient is starting meds, will need US  for growth q 4 weeks, first due around 30 weeks. Order placed. RTC in 2 weeks. For remaining 28 week labs and Tdap then.

## 2023-04-30 ENCOUNTER — Telehealth: Payer: Self-pay | Admitting: Obstetrics and Gynecology

## 2023-04-30 NOTE — Telephone Encounter (Signed)
 Contacted the patient via phone, I left message asking the patient to contact our office to confirm this scheduled appointment. ( Friday, 3/14 at 9:15 am for ultrasound).

## 2023-04-30 NOTE — Telephone Encounter (Signed)
-----   Message from [place] sent at 04/29/2023  5:22 PM EST ----- Regarding: schedule appointment Please schedule patient for growth scan in 5 weeks.

## 2023-05-03 NOTE — Telephone Encounter (Signed)
 Contacted the patient via phone, I left message asking the patient to contact our office to confirm this scheduled appointment. The patient is aware via my chart message!

## 2023-05-08 ENCOUNTER — Encounter: Payer: Self-pay | Admitting: Obstetrics and Gynecology

## 2023-05-10 ENCOUNTER — Other Ambulatory Visit: Payer: Self-pay | Admitting: Obstetrics and Gynecology

## 2023-05-10 MED ORDER — GLYBURIDE 5 MG PO TABS: 5.0000 mg | ORAL_TABLET | Freq: Two times a day (BID) | ORAL | 4 refills | Status: DC

## 2023-05-13 ENCOUNTER — Ambulatory Visit (INDEPENDENT_AMBULATORY_CARE_PROVIDER_SITE_OTHER): Payer: Managed Care, Other (non HMO) | Admitting: Obstetrics and Gynecology

## 2023-05-13 ENCOUNTER — Encounter: Payer: Self-pay | Admitting: Obstetrics and Gynecology

## 2023-05-13 ENCOUNTER — Encounter: Payer: Managed Care, Other (non HMO) | Admitting: Obstetrics and Gynecology

## 2023-05-13 VITALS — BP 115/76 | HR 80 | Wt 225.0 lb

## 2023-05-13 DIAGNOSIS — O0990 Supervision of high risk pregnancy, unspecified, unspecified trimester: Secondary | ICD-10-CM | POA: Diagnosis not present

## 2023-05-13 DIAGNOSIS — E119 Type 2 diabetes mellitus without complications: Secondary | ICD-10-CM | POA: Diagnosis not present

## 2023-05-13 DIAGNOSIS — O99212 Obesity complicating pregnancy, second trimester: Secondary | ICD-10-CM

## 2023-05-13 DIAGNOSIS — Z23 Encounter for immunization: Secondary | ICD-10-CM

## 2023-05-13 DIAGNOSIS — Z7984 Long term (current) use of oral hypoglycemic drugs: Secondary | ICD-10-CM

## 2023-05-13 DIAGNOSIS — Z113 Encounter for screening for infections with a predominantly sexual mode of transmission: Secondary | ICD-10-CM | POA: Diagnosis not present

## 2023-05-13 DIAGNOSIS — Z3A27 27 weeks gestation of pregnancy: Secondary | ICD-10-CM

## 2023-05-13 DIAGNOSIS — Z3480 Encounter for supervision of other normal pregnancy, unspecified trimester: Secondary | ICD-10-CM

## 2023-05-13 DIAGNOSIS — O09292 Supervision of pregnancy with other poor reproductive or obstetric history, second trimester: Secondary | ICD-10-CM

## 2023-05-13 DIAGNOSIS — E669 Obesity, unspecified: Secondary | ICD-10-CM

## 2023-05-13 DIAGNOSIS — O24112 Pre-existing diabetes mellitus, type 2, in pregnancy, second trimester: Secondary | ICD-10-CM | POA: Diagnosis not present

## 2023-05-13 DIAGNOSIS — O09299 Supervision of pregnancy with other poor reproductive or obstetric history, unspecified trimester: Secondary | ICD-10-CM

## 2023-05-13 DIAGNOSIS — Z130 Encounter for screening for diseases of the blood and blood-forming organs and certain disorders involving the immune mechanism: Secondary | ICD-10-CM

## 2023-05-13 DIAGNOSIS — O99210 Obesity complicating pregnancy, unspecified trimester: Secondary | ICD-10-CM

## 2023-05-13 NOTE — Patient Instructions (Signed)
 Tdap (Tetanus, Diphtheria, Pertussis) Vaccine: What You Need to Know Many vaccine information statements are available in Spanish and other languages. See PromoAge.com.br. 1. Why get vaccinated? Tdap vaccine can prevent tetanus, diphtheria, and pertussis. Diphtheria and pertussis spread from person to person. Tetanus enters the body through cuts or wounds. TETANUS (T) causes painful stiffening of the muscles. Tetanus can lead to serious health problems, including being unable to open the mouth, having trouble swallowing and breathing, or death. DIPHTHERIA (D) can lead to difficulty breathing, heart failure, paralysis, or death. PERTUSSIS (aP), also known as "whooping cough," can cause uncontrollable, violent coughing that makes it hard to breathe, eat, or drink. Pertussis can be extremely serious especially in babies and young children, causing pneumonia, convulsions, brain damage, or death. In teens and adults, it can cause weight loss, loss of bladder control, passing out, and rib fractures from severe coughing. 2. Tdap vaccine Tdap is only for children 7 years and older, adolescents, and adults.  Adolescents should receive a single dose of Tdap, preferably at age 76 or 12 years. Pregnant people should get a dose of Tdap during every pregnancy, preferably during the early part of the third trimester, to help protect the newborn from pertussis. Infants are most at risk for severe, life-threatening complications from pertussis. Adults who have never received Tdap should get a dose of Tdap. Also, adults should receive a booster dose of either Tdap or Td (a different vaccine that protects against tetanus and diphtheria but not pertussis) every 10 years, or after 5 years in the case of a severe or dirty wound or burn. Tdap may be given at the same time as other vaccines. 3. Talk with your health care provider Tell your vaccine provider if the person getting the vaccine: Has had an allergic  reaction after a previous dose of any vaccine that protects against tetanus, diphtheria, or pertussis, or has any severe, life-threatening allergies Has had a coma, decreased level of consciousness, or prolonged seizures within 7 days after a previous dose of any pertussis vaccine (DTP, DTaP, or Tdap) Has seizures or another nervous system problem Has ever had Guillain-Barr Syndrome (also called "GBS") Has had severe pain or swelling after a previous dose of any vaccine that protects against tetanus or diphtheria In some cases, your health care provider may decide to postpone Tdap vaccination until a future visit. People with minor illnesses, such as a cold, may be vaccinated. People who are moderately or severely ill should usually wait until they recover before getting Tdap vaccine.  Your health care provider can give you more information. 4. Risks of a vaccine reaction Pain, redness, or swelling where the shot was given, mild fever, headache, feeling tired, and nausea, vomiting, diarrhea, or stomachache sometimes happen after Tdap vaccination. People sometimes faint after medical procedures, including vaccination. Tell your provider if you feel dizzy or have vision changes or ringing in the ears.  As with any medicine, there is a very remote chance of a vaccine causing a severe allergic reaction, other serious injury, or death. 5. What if there is a serious problem? An allergic reaction could occur after the vaccinated person leaves the clinic. If you see signs of a severe allergic reaction (hives, swelling of the face and throat, difficulty breathing, a fast heartbeat, dizziness, or weakness), call 9-1-1 and get the person to the nearest hospital. For other signs that concern you, call your health care provider.  Adverse reactions should be reported to the Vaccine Adverse Event Reporting  System (VAERS). Your health care provider will usually file this report, or you can do it yourself. Visit the  VAERS website at www.vaers.LAgents.no or call 437-731-6503. VAERS is only for reporting reactions, and VAERS staff members do not give medical advice. 6. The National Vaccine Injury Compensation Program The [employer] Vaccine Injury Compensation Program (VICP) is a federal program that was created to compensate people who may have been injured by certain vaccines. Claims regarding alleged injury or death due to vaccination have a time limit for filing, which may be as short as two years. Visit the VICP website at SpiritualWord.at or call (669) 837-1631 to learn about the program and about filing a claim. 7. How can I learn more? Ask your health care provider. Call your local or state health department. Visit the website of the Food and Drug Administration (FDA) for vaccine package inserts and additional information at FinderList.no. Contact the Centers for Disease Control and Prevention (CDC): Call (484) 483-2759 (1-800-CDC-INFO) or Visit CDC's website at PicCapture.uy. Source: CDC Vaccine Information Statement Tdap (Tetanus, Diphtheria, Pertussis) Vaccine (10/27/2019) This same material is available at FootballExhibition.com.br for no charge. This information is not intended to replace advice given to you by your health care provider. Make sure you discuss any questions you have with your health care provider. Document Revised: 06/24/2022 Document Reviewed: 04/24/2022 Elsevier Patient Education  2024 [employer].

## 2023-05-13 NOTE — Progress Notes (Signed)
ROB: Patient is a 33 y.o. G3P2002 at [redacted]w[redacted]d who presents for routine OB care.  Pregnancy is complicated by  has Obesity in pregnancy, antepartum; Supervision of other normal pregnancy, antepartum; History of gestational diabetes mellitus (GDM); History of macrosomia in infant in prior pregnancy, currently pregnant; and Diabetes mellitus affecting pregnancy, antepartum on their problem list..   Patient denies complaints.  Was initiated on Glyburide 2 weeks ago for uncontrolled Type II DM.  After review of blood sugars at the end of last week, Recommended increasing from 2.5 mg up to 5 mg BID.  Today blood sugars reviewed, fastings starting to trend down (from 120s-130s now down to 110s-120s with occasional 80s. Postprandial dinners still ranging from 120s-150s. Most lunches are controlled with an occasional 130s.  Reviewed dietary log, still very carb heavy.  Strongly encouraged to limit carbohydrate intake and replace bad carbs with good carbs. Given sample meal plan.  Patient notes that lunch and dinner are hard for her as she typically does "grab and go" for lunch, and dinner is usually already prepared by her partner when she gets home late from work. Given sample dietary plan and encourage to adjust based on her dietary preferences. Also cautioned that if she remains uncontrolled and is maxed out on oral medications, that transition to insulin would be indicated.  Patient notes understanding. Will schedule first growth scan for next visit, and will need to begin antenatal testing weekly at 32 weeks at that time as well. 28 week labs done today.BTC done, Tdap given. RTC in 2 weeks.

## 2023-05-13 NOTE — Progress Notes (Signed)
ROB [redacted]w[redacted]d: She is doing well. She reports good fetal movement and has no new concerns today. BTC and TDAP done today.

## 2023-05-14 LAB — CBC
Hematocrit: 33.4 % — ABNORMAL LOW (ref 34.0–46.6)
Hemoglobin: 11.7 g/dL (ref 11.1–15.9)
MCH: 29.9 pg (ref 26.6–33.0)
MCHC: 35 g/dL (ref 31.5–35.7)
MCV: 85 fL (ref 79–97)
Platelets: 175 10*3/uL (ref 150–450)
RBC: 3.91 x10E6/uL (ref 3.77–5.28)
RDW: 15 % (ref 11.7–15.4)
WBC: 7.1 10*3/uL (ref 3.4–10.8)

## 2023-05-14 LAB — HEMOGLOBIN A1C
Est. average glucose Bld gHb Est-mCnc: 120 mg/dL
Hgb A1c MFr Bld: 5.8 % — ABNORMAL HIGH (ref 4.8–5.6)

## 2023-05-14 LAB — RPR: RPR Ser Ql: NONREACTIVE

## 2023-05-18 ENCOUNTER — Encounter: Payer: Self-pay | Admitting: Obstetrics and Gynecology

## 2023-05-27 ENCOUNTER — Encounter: Payer: Self-pay | Admitting: Advanced Practice Midwife

## 2023-05-27 ENCOUNTER — Ambulatory Visit: Payer: Managed Care, Other (non HMO) | Admitting: Advanced Practice Midwife

## 2023-05-27 VITALS — BP 115/70 | HR 102 | Wt 226.6 lb

## 2023-05-27 DIAGNOSIS — O0993 Supervision of high risk pregnancy, unspecified, third trimester: Secondary | ICD-10-CM

## 2023-05-27 DIAGNOSIS — O24919 Unspecified diabetes mellitus in pregnancy, unspecified trimester: Secondary | ICD-10-CM

## 2023-05-27 DIAGNOSIS — Z3A29 29 weeks gestation of pregnancy: Secondary | ICD-10-CM | POA: Diagnosis not present

## 2023-05-27 LAB — POCT URINALYSIS DIPSTICK
Bilirubin, UA: NEGATIVE
Blood, UA: NEGATIVE
Glucose, UA: NEGATIVE
Ketones, UA: NEGATIVE
Leukocytes, UA: NEGATIVE
Nitrite, UA: NEGATIVE
Protein, UA: NEGATIVE
Spec Grav, UA: 1.015 (ref 1.010–1.025)
Urobilinogen, UA: 0.2 U/dL
pH, UA: 7 (ref 5.0–8.0)

## 2023-05-27 MED ORDER — GLYBURIDE 2.5 MG PO TABS
7.5000 mg | ORAL_TABLET | Freq: Every day | ORAL | 1 refills | Status: DC
Start: 2023-05-27 — End: 2023-07-31

## 2023-05-27 MED ORDER — GLYBURIDE 5 MG PO TABS: 5.0000 mg | ORAL_TABLET | Freq: Every day | ORAL | 4 refills | Status: DC

## 2023-05-27 NOTE — Progress Notes (Signed)
 Routine Prenatal Care Visit  Subjective  Laura [name] is a 33 y.o. G3P2002 at [redacted]w[redacted]d being seen today for ongoing prenatal care.  She is currently monitored for the following issues for this high-risk pregnancy and has Obesity in pregnancy, antepartum; Supervision of high-risk pregnancy; History of gestational diabetes mellitus (GDM); History of macrosomia in infant in prior pregnancy, currently pregnant; Type 2 diabetes mellitus (HCC); Diabetes mellitus affecting pregnancy, antepartum; and Tobacco use disorder on their problem list.  ----------------------------------------------------------------------------------- Patient reports she is doing well. Has made some adjustments to diet. Has not yet increased physical activity. Reviewed BS log- most values are still elevated. Protein adequate, encouraged decrease simple carbs and increase physical activity. Discussed increasing bedtime dose to 7.5 mg (per patient's discussion with Dr Valentino Saxon at last visit may need to increase dose).  Contractions: Not present. Vag. Bleeding: None.  Movement: Present. Leaking Fluid denies.  ----------------------------------------------------------------------------------- The following portions of the patient's history were reviewed and updated as appropriate: allergies, current medications, past family history, past medical history, past social history, past surgical history and problem list. Problem list updated.  Objective  Blood pressure 115/70, pulse (!) 102, weight 226 lb 9.6 oz (102.8 kg), last menstrual period 11/04/2022. Pregravid weight 200 lb (90.7 kg) Total Weight Gain 26 lb 9.6 oz (12.1 kg) Urinalysis: Urine Protein    Urine Glucose    Fetal Status: Fetal Heart Rate (bpm): 148   Movement: Present     BS:  Fasting: range from 71-175 (had donuts last night!) 3/10 normal, 2/10 110s/120s, 4/10 130s, 1/10 175 After meals: 19/26 wnl, 7/26 elevated (mostly 120s, also 156, 180, 198)  General:  Alert,  oriented and cooperative. Patient is in no acute distress.  Skin: Skin is warm and dry. No rash noted.   Cardiovascular: Normal heart rate noted  Respiratory: Normal respiratory effort, no problems with respiration noted  Abdomen: Soft, gravid, appropriate for gestational age. Pain/Pressure: Absent     Pelvic:  Cervical exam deferred        Extremities: Normal range of motion.  Edema: None  Mental Status: Normal mood and affect. Normal behavior. Normal judgment and thought content.   Assessment   33 y.o. G3P2002 at [redacted]w[redacted]d by  08/11/2023, by Last Menstrual Period presenting for routine prenatal visit  Plan   third Problems (from 12/15/22 to present)     Problem Noted Diagnosed Resolved   Supervision of high-risk pregnancy 12/10/2022 by Glenetta Borg, CNM  No   Overview Addendum 05/13/2023  4:19 PM by Tommie Raymond, CMA   Clinical Staff Provider  Office Location  [facility] Dating  08/11/2023, Date entered prior to episode creation  Language  English Anatomy US    Flu Vaccine  Declined Genetic Screen  NIPS:   TDaP vaccine  05/13/23 Hgb A1C or  GTT Early : Third trimester :   Covid No boosters   LAB RESULTS   Rhogam  A/Positive/-- (11/06 1522)  Blood Type A/Positive/-- (11/06 1522) A+ 2018  RSV N/A Antibody Negative (11/06 1522)Neg 2018  Feeding Plan breast Rubella 3.77 (11/06 1522)Immune 2017  Contraception mirena RPR Non Reactive (11/06 1522) Reactive 2018  Circumcision yes HBsAg Negative (11/06 1522) Negative 2017  Pediatrician  [facility] HIV Non Reactive (11/06 1522)Non reactive 2017  Support Person Luisa Hart Varicella Reactive (11/06 1522)Equivocal 2017  Prenatal Classes no GBS  (For PCN allergy, check sensitivities)     Hep C Non Reactive (11/06 1522)   BTL Consent  Pap Diagnosis  Date  Value Ref Range Status  01/27/2023   Corrected   - Negative for Intraepithelial Lesions or Malignancy (NILM)  01/27/2023 - Benign reactive/reparative changes  Corrected    VBAC  Consent  Hgb Electro      CF      SMA   Quant/T. pallidium 1:1 2017                Preterm labor symptoms and general obstetric precautions including but not limited to vaginal bleeding, contractions, leaking of fluid and fetal movement were reviewed in detail with the patient. Please refer to After Visit Summary for other counseling recommendations.   Elevated fasting blood sugar; increase HS dose of Glyburide to 7.5  Return in about 2 weeks (around 06/10/2023) for rob.  Tresea Mall, CNM 05/27/2023 4:09 PM

## 2023-06-04 ENCOUNTER — Ambulatory Visit: Payer: Managed Care, Other (non HMO)

## 2023-06-04 DIAGNOSIS — O24919 Unspecified diabetes mellitus in pregnancy, unspecified trimester: Secondary | ICD-10-CM

## 2023-06-04 DIAGNOSIS — O99210 Obesity complicating pregnancy, unspecified trimester: Secondary | ICD-10-CM

## 2023-06-04 DIAGNOSIS — O99213 Obesity complicating pregnancy, third trimester: Secondary | ICD-10-CM | POA: Diagnosis not present

## 2023-06-04 DIAGNOSIS — O0990 Supervision of high risk pregnancy, unspecified, unspecified trimester: Secondary | ICD-10-CM

## 2023-06-04 DIAGNOSIS — Z3A32 32 weeks gestation of pregnancy: Secondary | ICD-10-CM | POA: Diagnosis not present

## 2023-06-04 DIAGNOSIS — O24415 Gestational diabetes mellitus in pregnancy, controlled by oral hypoglycemic drugs: Secondary | ICD-10-CM | POA: Diagnosis not present

## 2023-06-04 DIAGNOSIS — E669 Obesity, unspecified: Secondary | ICD-10-CM | POA: Diagnosis not present

## 2023-06-04 DIAGNOSIS — O09299 Supervision of pregnancy with other poor reproductive or obstetric history, unspecified trimester: Secondary | ICD-10-CM

## 2023-06-10 ENCOUNTER — Encounter: Admitting: Obstetrics

## 2023-06-10 ENCOUNTER — Ambulatory Visit (INDEPENDENT_AMBULATORY_CARE_PROVIDER_SITE_OTHER): Admitting: Obstetrics

## 2023-06-10 ENCOUNTER — Encounter: Payer: Self-pay | Admitting: Obstetrics

## 2023-06-10 VITALS — BP 106/62 | HR 102 | Wt 222.0 lb

## 2023-06-10 DIAGNOSIS — Z3A31 31 weeks gestation of pregnancy: Secondary | ICD-10-CM

## 2023-06-10 DIAGNOSIS — O24919 Unspecified diabetes mellitus in pregnancy, unspecified trimester: Secondary | ICD-10-CM | POA: Diagnosis not present

## 2023-06-10 DIAGNOSIS — O99210 Obesity complicating pregnancy, unspecified trimester: Secondary | ICD-10-CM | POA: Diagnosis not present

## 2023-06-10 DIAGNOSIS — O09299 Supervision of pregnancy with other poor reproductive or obstetric history, unspecified trimester: Secondary | ICD-10-CM | POA: Diagnosis not present

## 2023-06-10 NOTE — Assessment & Plan Note (Signed)
-  Reviewed s/s of PTL and when to go to the hospital

## 2023-06-10 NOTE — Progress Notes (Signed)
    Return Prenatal Note   Assessment/Plan   Plan  33 y.o. G3P2002 at [redacted]w[redacted]d presents for follow-up OB visit. Reviewed prenatal record including previous visit note.  Type 2 diabetes mellitus (HCC) -Reviewed BS log today. Postprandials all normal, all fastings are elevated >100 -Currently taking 5 mg of glyburide in the AM and 7.5 mg at night. Discussed with Dr. Logan Bores. Will increase PM dose to 10 mg.  -Return in one week with blood sugar log for MD visit to review -Reviewed [place], growth in 95th%ile -Needs fetal echo. MFM order placed.  Supervision of high-risk pregnancy -Reviewed s/s of PTL and when to go to the hospital   No orders of the defined types were placed in this encounter.  Return in about 1 week (around 06/17/2023).   Future Appointments  Date Time Provider Department Center  06/16/2023  9:35 AM Hildred Laser, MD [facility] None    For next visit:   ROB with blood sugar review    Subjective   Viktoria is feeling well overall and has been figuring out how to control her blood sugar through diet. Movement: Present Contractions: Irritability  Objective   Flow sheet Vitals: Pulse Rate: (!) 102 BP: 106/62 Fundal Height: 37 cm Fetal Heart Rate (bpm): 145 Total weight gain: 22 lb (9.979 kg)  General Appearance  No acute distress, well appearing, and well nourished Pulmonary   Normal work of breathing Neurologic   Alert and oriented to person, place, and time Psychiatric   Mood and affect within normal limits  Guadlupe Spanish, CNM 06/10/23 12:00 PM

## 2023-06-10 NOTE — Assessment & Plan Note (Addendum)
-  Reviewed BS log today. Postprandials all normal, all fastings are elevated >100 -Currently taking 5 mg of glyburide in the AM and 7.5 mg at night. Discussed with Dr. Logan Bores. Will increase PM dose to 10 mg.  -Return in one week with blood sugar log for MD visit to review -Reviewed [place], growth in 95th%ile -Needs fetal echo. MFM order placed.

## 2023-06-11 ENCOUNTER — Telehealth: Payer: Self-pay

## 2023-06-11 DIAGNOSIS — E119 Type 2 diabetes mellitus without complications: Secondary | ICD-10-CM

## 2023-06-11 NOTE — Telephone Encounter (Signed)
 Left voicemail to return call. Will send my chart message.

## 2023-06-11 NOTE — Progress Notes (Signed)
 This encounter was created in error - please disregard.

## 2023-06-16 ENCOUNTER — Ambulatory Visit (INDEPENDENT_AMBULATORY_CARE_PROVIDER_SITE_OTHER): Admitting: Obstetrics and Gynecology

## 2023-06-16 VITALS — BP 106/64 | HR 93 | Wt 222.6 lb

## 2023-06-16 DIAGNOSIS — Z7984 Long term (current) use of oral hypoglycemic drugs: Secondary | ICD-10-CM | POA: Diagnosis not present

## 2023-06-16 DIAGNOSIS — Z3A31 31 weeks gestation of pregnancy: Secondary | ICD-10-CM | POA: Insufficient documentation

## 2023-06-16 DIAGNOSIS — O09299 Supervision of pregnancy with other poor reproductive or obstetric history, unspecified trimester: Secondary | ICD-10-CM

## 2023-06-16 DIAGNOSIS — O0993 Supervision of high risk pregnancy, unspecified, third trimester: Secondary | ICD-10-CM

## 2023-06-16 DIAGNOSIS — E669 Obesity, unspecified: Secondary | ICD-10-CM | POA: Diagnosis not present

## 2023-06-16 DIAGNOSIS — O99213 Obesity complicating pregnancy, third trimester: Secondary | ICD-10-CM

## 2023-06-16 DIAGNOSIS — O24113 Pre-existing diabetes mellitus, type 2, in pregnancy, third trimester: Secondary | ICD-10-CM | POA: Diagnosis not present

## 2023-06-16 DIAGNOSIS — E119 Type 2 diabetes mellitus without complications: Secondary | ICD-10-CM

## 2023-06-16 DIAGNOSIS — Z3A32 32 weeks gestation of pregnancy: Secondary | ICD-10-CM | POA: Diagnosis not present

## 2023-06-16 LAB — POCT URINALYSIS DIPSTICK OB
Appearance: NORMAL
Bilirubin, UA: NEGATIVE
Blood, UA: NEGATIVE
Glucose, UA: NEGATIVE
Leukocytes, UA: NEGATIVE
Nitrite, UA: NEGATIVE
Odor: NORMAL
POC,PROTEIN,UA: NEGATIVE
Spec Grav, UA: 1.02 (ref 1.010–1.025)
Urobilinogen, UA: 0.2 U/dL
pH, UA: 5 (ref 5.0–8.0)

## 2023-06-16 MED ORDER — INSULIN GLARGINE 100 UNITS/ML SOLOSTAR PEN
8.0000 [IU] | PEN_INJECTOR | Freq: Every day | SUBCUTANEOUS | 11 refills | Status: DC
Start: 2023-06-16 — End: 2023-07-31

## 2023-06-16 NOTE — Telephone Encounter (Signed)
 Laura [name] left voice msg on triage saying that she reached out to [facility] and was advised the soonest they can see her was May 7. She was asked by them to ask her if this was ok or if she would need something sooner. Please advise.

## 2023-06-16 NOTE — Patient Instructions (Signed)
 Third Trimester of Pregnancy  The third trimester of pregnancy is from week 28 through week 40. This is months 7 through 9. The third trimester is a time when your baby is growing fast. Body changes during your third trimester Your body continues to change during this time. The changes usually go away after your baby is born. Physical changes You will continue to gain weight. You may get stretch marks on your hips, belly, and breasts. Your breasts will keep growing and may hurt. A yellow fluid (colostrum) may leak from your breasts. This is the first milk you're making for your baby. Your hair may grow faster and get thicker. In some cases, you may get hair loss. Your belly button may stick out. You may have more swelling in your hands, face, or ankles. Health changes You may have heartburn. You may feel short of breath. This is caused by the uterus that is now bigger. You may have more aches in the pelvis, back, or thighs. You may have more tingling or numbness in your hands, arms, and legs. You may pee more often. You may have trouble pooping (constipation) or swollen veins in the butt that can itch or get painful (hemorrhoids). Other changes You may have more problems sleeping. You may notice the baby moving lower in your belly (dropping). You may have more fluid coming from your vagina. Your joints may feel loose, and you may have pain around your pelvic bone. Follow these instructions at home: Medicines Take medicines only as told by your health care provider. Some medicines are not safe during pregnancy. Your provider may change the medicines that you take. Do not take any medicines unless told to by your provider. Take a prenatal vitamin that has at least 600 micrograms (mcg) of folic acid. Do not use herbal medicines, illegal drugs, or medicines that are not approved by your provider. Eating and drinking While you're pregnant your body needs additional nutrition to help  support your growing baby. Talk with your provider about your nutritional needs. Activity Most women are able to exercise regularly during pregnancy. Exercise routines may need to change at the end of your pregnancy. Talk to your provider about your activities and exercise routine. Relieving pain and discomfort Rest often with your legs raised if you have leg cramps or low back pain. Take warm sitz baths to soothe pain from hemorrhoids. Use hemorrhoid cream if your provider says it's okay. Wear a good, supportive bra if your breasts hurt. Do not use hot tubs, steam rooms, or saunas. Do not douche. Do not use tampons or scented pads. Safety Talk to your provider before traveling far distances. Wear your seatbelt at all times when you're in a car. Talk to your provider if someone hits you, hurts you, or yells at you. Preparing for birth To prepare for your baby: Take childbirth and breastfeeding classes. Visit the hospital and tour the maternity area. Buy a rear-facing car seat. Learn how to install it in your car. General instructions Avoid cat litter boxes and soil used by cats. These things carry germs that can cause harm to your pregnancy and your baby. Do not drink alcohol, smoke, vape, or use products with nicotine or tobacco in them. If you need help quitting, talk with your provider. Keep all follow-up visits for your third trimester. Your provider will do more exams and tests during this trimester. Write down your questions. Take them to your prenatal visits. Your provider also will: Talk with you about  your overall health. Give you advice or refer you to specialists who can help with different needs, including: Mental health and counseling. Foods and healthy eating. Ask for help if you need help with food. Where to find more information American Pregnancy Association: americanpregnancy.org [employer]: acog.org Office on [employer] Health:  [employer] Contact a health care provider if: You have a headache that does not go away when you take medicine. You have any of these problems: You can't eat or drink. You have nausea and vomiting. You have watery poop (diarrhea) for 2 days or more. You have pain when you pee, or your pee smells bad. You have been sick for 2 days or more and aren't getting better. Contact your provider right away if: You have any of these coming from your vagina: Abnormal discharge. Bad-smelling fluid. Bleeding. Your baby is moving less than usual. You have signs of labor: You have any contractions, belly cramping, or have pain in your pelvis or lower back before 37 weeks of pregnancy (preterm labor). You have regular contractions that are less than 5 minutes apart. Your water breaks. You have symptoms of high blood pressure or preeclampsia. These include: A severe, throbbing headache that does not go away. Sudden or extreme swelling of your face, hands, legs, or feet. Vision problems: You see spots. You have blurry vision. Your eyes are sensitive to light. If you can't reach your provider, go to an urgent care or emergency room. Get help right away if: You faint, become confused, or can't think clearly. You have chest pain or trouble breathing. You have any kind of injury, such as from a fall or a car crash. These symptoms may be an emergency. Call 911 right away. Do not wait to see if the symptoms will go away. Do not drive yourself to the hospital. This information is not intended to replace advice given to you by your health care provider. Make sure you discuss any questions you have with your health care provider. Document Revised: 12/10/2022 Document Reviewed: 07/10/2022 Elsevier Patient Education  2024 Elsevier Inc.Nonstress Test A nonstress test is a procedure that is done during pregnancy in order to check the baby's heartbeat. The procedure can help to show if the baby (fetus) is  healthy. It is commonly done when: The baby is past his or her due date. The pregnancy is high risk. The baby is moving less than normal. The mother has lost a pregnancy in the past. The health care provider suspects a problem with the baby's growth. There is too much or too little amniotic fluid. The procedure is often done in the third trimester of pregnancy to find out if an early delivery is needed and whether such a delivery is safe. During a nonstress test, the baby's heartbeat is monitored when the baby is resting and when the baby is moving. If the baby is healthy, the heart rate will increase when he or she moves or kicks and will return to normal when he or she rests. Tell a health care provider about: Any allergies you have. Any medical conditions you have. All medicines you are taking, including vitamins, herbs, eye drops, creams, and over-the-counter medicines. Any surgeries you have had. Any past pregnancies you have had. What are the risks? There are no risks to you or your baby from a nonstress test. This procedure should not be painful or uncomfortable. What happens before the procedure? Eat a meal right before the test or as directed  by your health care provider. Food may help encourage the baby to move. Use the restroom right before the test. What happens during the procedure?  Two monitors will be placed on your abdomen. One will record the baby's heart rate and the other will record the contractions of your uterus. You may be asked to lie down on your side or to sit upright. You may be given a button to press when you feel your baby move. Your health care provider will listen to your baby's heartbeat and record it. He or she may also watch your baby's heartbeat on a screen. If the baby seems to be sleeping, you may be asked to drink some juice or soda, eat a snack, or change positions. The procedure may vary among health care providers and hospitals. What can I expect  after procedure? Your health care provider will discuss the test results with you and make recommendations for the future. Depending on the results, your health care provider may order additional tests or another course of action. If your health care provider gave you any diet or activity instructions, make sure to follow them. Keep all follow-up visits. This is important. Summary A nonstress test is a procedure that is done during pregnancy in order to check the baby's heartbeat. The procedure can help show if the baby is healthy. The procedure is often done in the third trimester of pregnancy to find out if an early delivery is needed and whether such a delivery is safe. During a nonstress test, the baby's heartbeat is monitored when the baby is resting and when the baby is moving. If the baby is healthy, the heart rate will increase when he or she moves or kicks and will return to normal when he or she rests. Your health care provider will discuss the test results with you and make recommendations for the future. This information is not intended to replace advice given to you by your health care provider. Make sure you discuss any questions you have with your health care provider. Document Revised: 11/20/2020 Document Reviewed: 12/18/2019 Elsevier Patient Education  2024 [employer].

## 2023-06-16 NOTE — Progress Notes (Signed)
 ROB [redacted]w[redacted]d: Patient reports good fetal movement with braxton hicks ctx. Patient states blood sugars are better with increase glyburide 10mg  at bedtime with Fastings 115-120 and pp 90-115. Laura [name] 06/02/1990 [redacted]w[redacted]d  Fetus A Non-Stress Test Interpretation for 06/16/23  Indication:  T2DM Oral Control, Obesity  Fetal Heart Rate A Mode: External Baseline Rate (A): 150 bpm Variability: Moderate Accelerations: 15 x 15 Decelerations: None Scalp Stimulation: Negative  Uterine Activity Mode: Toco Contraction Frequency (min): None  Interpretation (Fetal Testing) Nonstress Test Interpretation: Reactive Overall Impression: Reassuring for gestational age

## 2023-06-16 NOTE — Progress Notes (Signed)
 ROB: Patient is a 33 y.o. G3P2002 at [redacted]w[redacted]d who presents for routine OB care.  Pregnancy is complicated by Obesity affecting pregnancy in third trimester; Supervision of high-risk pregnancy; History of gestational diabetes mellitus (GDM); History of macrosomia in infant in prior pregnancy, currently pregnant; Type 2 diabetes mellitus (HCC); and Tobacco use disorder.    Patient denies major complaints.  Blood sugar log reviewed.  Fastings between 115-120s.  Postprandials are between 90s -115.  Currently on glyburide 5 mg in a.m. and 10 mg in p.m.  I discussed that at this time we have reached the maximum dose for her evening glyburide.  At this time would recommend something supplemental tighter glucose control of her fasting blood sugars, options include metformin and insulin.  It is my belief that insulin would be superior to metformin in this case and discussed this with patient.  Patient okay to consider initiation of insulin.  Will start on Lantus 8 units at bedtime.  Patient will follow-up in 1 week for review of blood sugars.  Patient is also still overdue for having her fetal echo.  Unable to be scheduled with [facility] until May, will attempt to schedule with [facility] if she can get an earlier appointment.  Next growth scan will be due in 2 weeks, and for [facility]PP.  NST performed today was reviewed and was found to be reactive.  Continue recommended antenatal testing and prenatal care.

## 2023-06-17 NOTE — Telephone Encounter (Signed)
 Left voicemail advising referral has been sent. They will contact her to schedule. They advised their openings are mid April. Will send contact information via my chart.

## 2023-06-17 NOTE — Telephone Encounter (Signed)
 Spoke with patient regarding Fetal Echo referral. Advised will send to [facility] and follow up with her.

## 2023-06-18 ENCOUNTER — Encounter: Payer: Self-pay | Admitting: Obstetrics and Gynecology

## 2023-06-18 NOTE — Telephone Encounter (Signed)
 Chart reviewed. E-Prescribing Status: Receipt confirmed by pharmacy (06/16/2023 10:39 AM EDT) . Spoke to pharmacy who verified rx is on file, but not processed. There was an insurance error holding it up stating patient has two insurances needing to verify primary. Advised [employer] and id # provided. They advised they do not have it in stock, but the [facility] dose. Will advise patient to contact [facility] to request it to be filled there.

## 2023-06-21 ENCOUNTER — Encounter: Payer: Self-pay | Admitting: Obstetrics and Gynecology

## 2023-06-23 ENCOUNTER — Ambulatory Visit: Admitting: Obstetrics and Gynecology

## 2023-06-23 ENCOUNTER — Other Ambulatory Visit

## 2023-06-23 ENCOUNTER — Encounter: Payer: Self-pay | Admitting: Obstetrics and Gynecology

## 2023-06-23 VITALS — BP 110/74 | HR 96 | Wt 223.2 lb

## 2023-06-23 DIAGNOSIS — O0993 Supervision of high risk pregnancy, unspecified, third trimester: Secondary | ICD-10-CM

## 2023-06-23 DIAGNOSIS — Z794 Long term (current) use of insulin: Secondary | ICD-10-CM

## 2023-06-23 DIAGNOSIS — E119 Type 2 diabetes mellitus without complications: Secondary | ICD-10-CM

## 2023-06-23 DIAGNOSIS — O24113 Pre-existing diabetes mellitus, type 2, in pregnancy, third trimester: Secondary | ICD-10-CM | POA: Diagnosis not present

## 2023-06-23 DIAGNOSIS — O99213 Obesity complicating pregnancy, third trimester: Secondary | ICD-10-CM | POA: Diagnosis not present

## 2023-06-23 DIAGNOSIS — E669 Obesity, unspecified: Secondary | ICD-10-CM | POA: Diagnosis not present

## 2023-06-23 DIAGNOSIS — Z3A33 33 weeks gestation of pregnancy: Secondary | ICD-10-CM

## 2023-06-23 DIAGNOSIS — O09299 Supervision of pregnancy with other poor reproductive or obstetric history, unspecified trimester: Secondary | ICD-10-CM

## 2023-06-23 NOTE — Progress Notes (Signed)
 ROB: Patient is a 33 y.o. G3P2002 at [redacted]w[redacted]d who presents for routine OB care.  Pregnancy is complicated by: Obesity affecting pregnancy in third trimester; Supervision of high-risk pregnancy; History of gestational diabetes mellitus (GDM); History of macrosomia in infant in prior pregnancy, currently pregnant; Type 2 diabetes mellitus (HCC); Diabetes mellitus affecting pregnancy, antepartum; and Tobacco use disorder.   Patient denies major complaints today. Feels good fetal movement, denies contractions/bleeding/LOF.  Notes difficulties in getting her new prescription for insulin last week, so was not able to start her Lantus until 3 days ago. Still taking Glyburide 5 AM/10 PM.  Reviewed blood sugar log, fastings slowly trending down from 110s-120s.  Recommend increasing Lantus from 8 units to 10 units. Other postprandials still within control. Notes that both places that she was referred for her fetal echo cannot see her until May.  Will continue to work on getting patient scheduled. NST performed today was reviewed and was found to be reactive.  Continue recommended antenatal testing and prenatal care.  Has next ultrasound with MFM next week and fu OB visit.    Fetus A Non-Stress Test Interpretation for 06/23/23  Indication: Diabetes and Obesity  Fetal Heart Rate A Mode: External Baseline Rate (A): 140 bpm Variability: Moderate Accelerations: 15 x 15 Decelerations: None Multiple birth?: No  Uterine Activity Mode: Toco Contraction Frequency (min): none  Interpretation (Fetal Testing) Nonstress Test Interpretation: Reactive Overall Impression: Reassuring for gestational age

## 2023-06-23 NOTE — Progress Notes (Signed)
 ROB [redacted]w[redacted]d: She is doing well. She reports good fetal movement and has no new concerns today. NST done today during visit.

## 2023-06-30 ENCOUNTER — Ambulatory Visit (INDEPENDENT_AMBULATORY_CARE_PROVIDER_SITE_OTHER): Admitting: Obstetrics

## 2023-06-30 ENCOUNTER — Ambulatory Visit
Admission: RE | Admit: 2023-06-30 | Discharge: 2023-06-30 | Disposition: A | Discharge status: Home / Self Care | Source: Ambulatory Visit | Attending: Obstetrics and Gynecology | Admitting: Obstetrics and Gynecology

## 2023-06-30 VITALS — BP 125/75 | HR 109 | Wt 223.0 lb

## 2023-06-30 DIAGNOSIS — O99213 Obesity complicating pregnancy, third trimester: Secondary | ICD-10-CM | POA: Insufficient documentation

## 2023-06-30 DIAGNOSIS — O09299 Supervision of pregnancy with other poor reproductive or obstetric history, unspecified trimester: Secondary | ICD-10-CM

## 2023-06-30 DIAGNOSIS — E119 Type 2 diabetes mellitus without complications: Secondary | ICD-10-CM

## 2023-06-30 DIAGNOSIS — O24113 Pre-existing diabetes mellitus, type 2, in pregnancy, third trimester: Secondary | ICD-10-CM

## 2023-06-30 DIAGNOSIS — Z8632 Personal history of gestational diabetes: Secondary | ICD-10-CM

## 2023-06-30 DIAGNOSIS — O0993 Supervision of high risk pregnancy, unspecified, third trimester: Secondary | ICD-10-CM | POA: Insufficient documentation

## 2023-06-30 DIAGNOSIS — Z3A34 34 weeks gestation of pregnancy: Secondary | ICD-10-CM | POA: Diagnosis not present

## 2023-06-30 DIAGNOSIS — F17200 Nicotine dependence, unspecified, uncomplicated: Secondary | ICD-10-CM

## 2023-06-30 NOTE — Progress Notes (Signed)
    Return Prenatal Note   Assessment/Plan   Plan  33 y.o. G3P2002 at [redacted]w[redacted]d presents for follow-up OB visit. Reviewed prenatal record including previous visit note.  Type 2 diabetes mellitus (HCC) -Fetal echo scheduled for this week -Fasting sugars have improved, although some are still > 95. Postprandials all WNL -See MD at next visit   Supervision of high-risk pregnancy -Results not yet available from BPP this AM. RNST. -Reviewed kick counts and preterm labor warning signs. Instructed to call office or come to hospital with persistent headache, vision changes, regular contractions, leaking of fluid, decreased fetal movement or vaginal bleeding.  -Recommend ice, rest for injury   No orders of the defined types were placed in this encounter.  Return in about 1 week (around 07/07/2023).   Future Appointments  Date Time Provider Department Center  07/06/2023  2:30 PM AOB-NST ROOM [facility] None  07/06/2023  3:15 PM Linzie Collin, MD [facility] None    For next visit:  ROB with NST    Subjective   Laura fell down the other day and is having pain in her vaginal area and pelvis. She is otherwise feeling well and making good progress with her blood sugar.  Movement: Present Contractions: Irritability  Objective   Flow sheet Vitals: Pulse Rate: (!) 109 BP: 125/75 Fundal Height: 35 cm Fetal Heart Rate (bpm): 123 Total weight gain: 23 lb (10.4 kg)  Laura [name] December 16, 1990 [redacted]w[redacted]d  Non-Stress Test Interpretation for 06/30/23  Indication: Diabetes  Fetal Heart Rate A Mode: External Baseline Rate (A): 135 bpm Variability: Moderate Accelerations: 15 x 15 Decelerations: None Scalp Stimulation: Negative Multiple birth?: No  Uterine Activity Mode: Toco Contraction Frequency (min): None  Interpretation (Fetal Testing) Nonstress Test Interpretation: Reactive Overall Impression: Reassuring for gestational age    General Appearance  No acute distress, well  appearing, and well nourished Pulmonary   Normal work of breathing Neurologic   Alert and oriented to person, place, and time Psychiatric   Mood and affect within normal limits  Laura [name], CNM 06/30/23 5:28 PM

## 2023-06-30 NOTE — Patient Instructions (Signed)

## 2023-06-30 NOTE — Assessment & Plan Note (Signed)
-  Results not yet available from BPP this AM. RNST. -Reviewed kick counts and preterm labor warning signs. Instructed to call office or come to hospital with persistent headache, vision changes, regular contractions, leaking of fluid, decreased fetal movement or vaginal bleeding.  -Recommend ice, rest for injury

## 2023-06-30 NOTE — Assessment & Plan Note (Signed)
-  Fetal echo scheduled for this week -Fasting sugars have improved, although some are still > 95. Postprandials all WNL -See MD at next visit

## 2023-07-02 ENCOUNTER — Encounter: Payer: Self-pay | Admitting: Obstetrics and Gynecology

## 2023-07-06 ENCOUNTER — Other Ambulatory Visit

## 2023-07-06 ENCOUNTER — Ambulatory Visit: Admitting: Obstetrics and Gynecology

## 2023-07-06 VITALS — BP 120/81 | HR 102 | Wt 228.1 lb

## 2023-07-06 DIAGNOSIS — O24113 Pre-existing diabetes mellitus, type 2, in pregnancy, third trimester: Secondary | ICD-10-CM | POA: Diagnosis not present

## 2023-07-06 DIAGNOSIS — O24919 Unspecified diabetes mellitus in pregnancy, unspecified trimester: Secondary | ICD-10-CM

## 2023-07-06 DIAGNOSIS — O0993 Supervision of high risk pregnancy, unspecified, third trimester: Secondary | ICD-10-CM

## 2023-07-06 DIAGNOSIS — O99213 Obesity complicating pregnancy, third trimester: Secondary | ICD-10-CM

## 2023-07-06 DIAGNOSIS — Z3A34 34 weeks gestation of pregnancy: Secondary | ICD-10-CM

## 2023-07-06 DIAGNOSIS — Z794 Long term (current) use of insulin: Secondary | ICD-10-CM

## 2023-07-06 DIAGNOSIS — E669 Obesity, unspecified: Secondary | ICD-10-CM

## 2023-07-06 DIAGNOSIS — O09299 Supervision of pregnancy with other poor reproductive or obstetric history, unspecified trimester: Secondary | ICD-10-CM

## 2023-07-06 NOTE — Patient Instructions (Signed)

## 2023-07-06 NOTE — Telephone Encounter (Signed)
 Documented in today's office visit.

## 2023-07-06 NOTE — Progress Notes (Signed)
 ROB:  EGA = 34.6  IDDM taking insulin at night only.  Fastings are elevated.  Have asked her to increase her nighttime dose to 12 units.  Scheduled an ultrasound follow-up for growth.  She has a history of macrosomia and believes this baby is "even bigger".  She is having issues with constipation.  She states she has not had a bowel movement in more than 2.5 weeks.  Use of Dulcolax, MiraLAX and enema discussed in detail.  Anesthesia consult needs to be ordered when she crosses 45 BMI.  NST today is reactive.

## 2023-07-06 NOTE — Progress Notes (Signed)
 ROB [redacted]w[redacted]d: NST today. Patient reports good fetal movement with pelvic pressure and braxton hicks contractions. Glucose log provided via my chart today. She states she has been constipated and taking colace 100mg  every day and miralax qd for the past week without relief. Last bowel movement 2.5 weeks ago.  Gestational diabetes blood glucose log: Date Fasting 2hr PP Breakfast 2hr PP  Lunch 2hr PP  Dinner  07/01/23 99 108 118 120  07/02/23 103 78 106 84  07/03/23 NA NA NA NA  07/04/23 109 87 118 110  07/05/23 102 119 88 115  07/06/23 92 111     Laura [name] [birth year] [redacted]w[redacted]d  Fetus A Non-Stress Test Interpretation for 07/06/23  Indication:  Obesity   Fetal Heart Rate A Mode: External Baseline Rate (A): 135 bpm Variability: Moderate Accelerations: 15 x 15 Decelerations: None Scalp Stimulation: Negative Multiple birth?: No  Uterine Activity Mode: Toco Contraction Frequency (min): None  Interpretation (Fetal Testing) Nonstress Test Interpretation: Reactive Overall Impression: Reassuring for gestational age

## 2023-07-13 ENCOUNTER — Encounter: Admitting: Obstetrics

## 2023-07-15 ENCOUNTER — Ambulatory Visit
Admission: RE | Admit: 2023-07-15 | Discharge: 2023-07-15 | Disposition: A | Discharge status: Home / Self Care | Source: Ambulatory Visit | Attending: Obstetrics and Gynecology | Admitting: Obstetrics and Gynecology

## 2023-07-15 DIAGNOSIS — O0990 Supervision of high risk pregnancy, unspecified, unspecified trimester: Secondary | ICD-10-CM | POA: Insufficient documentation

## 2023-07-15 DIAGNOSIS — Z3483 Encounter for supervision of other normal pregnancy, third trimester: Secondary | ICD-10-CM | POA: Diagnosis not present

## 2023-07-15 DIAGNOSIS — Z3482 Encounter for supervision of other normal pregnancy, second trimester: Secondary | ICD-10-CM | POA: Diagnosis not present

## 2023-07-15 DIAGNOSIS — O99210 Obesity complicating pregnancy, unspecified trimester: Secondary | ICD-10-CM | POA: Diagnosis present

## 2023-07-15 DIAGNOSIS — O09299 Supervision of pregnancy with other poor reproductive or obstetric history, unspecified trimester: Secondary | ICD-10-CM | POA: Diagnosis present

## 2023-07-15 DIAGNOSIS — E119 Type 2 diabetes mellitus without complications: Secondary | ICD-10-CM | POA: Insufficient documentation

## 2023-07-16 ENCOUNTER — Encounter: Payer: Self-pay | Admitting: Certified Nurse Midwife

## 2023-07-16 ENCOUNTER — Ambulatory Visit (INDEPENDENT_AMBULATORY_CARE_PROVIDER_SITE_OTHER): Admitting: Certified Nurse Midwife

## 2023-07-16 ENCOUNTER — Other Ambulatory Visit (HOSPITAL_COMMUNITY)
Admission: RE | Admit: 2023-07-16 | Discharge: 2023-07-16 | Disposition: A | Discharge status: Home / Self Care | Source: Ambulatory Visit | Attending: Certified Nurse Midwife | Admitting: Certified Nurse Midwife

## 2023-07-16 VITALS — BP 114/74 | HR 98 | Wt 230.3 lb

## 2023-07-16 DIAGNOSIS — Z113 Encounter for screening for infections with a predominantly sexual mode of transmission: Secondary | ICD-10-CM | POA: Diagnosis not present

## 2023-07-16 DIAGNOSIS — Z3A36 36 weeks gestation of pregnancy: Secondary | ICD-10-CM | POA: Diagnosis not present

## 2023-07-16 DIAGNOSIS — O0993 Supervision of high risk pregnancy, unspecified, third trimester: Secondary | ICD-10-CM | POA: Insufficient documentation

## 2023-07-16 DIAGNOSIS — Z3685 Encounter for antenatal screening for Streptococcus B: Secondary | ICD-10-CM | POA: Diagnosis not present

## 2023-07-16 NOTE — Progress Notes (Signed)
 Body mass index is 43.51 kg/m. Pt had growth u/s yesterday. Results not available yet.  GBS and cultures collected today.     ROB and NST for GDM on medications.   She reports good fetal movement  Date Fasting Breakfast Lunch Dinner  4/16 81 110 98 110  4/17  105  88 97  125  4/18 115 92  112 122  4/19 110 88  101 118  4/20      4/21 112 92 107 119  4/22 96 120  120  72  4/23     88                   97                  109                   110 4/24      72                  118 4/25       107                82  Consulted with Dr. Dell Fennel. Increase dose 15 u at bedtime. Pt in agreement  Awaiting growth u/s to determine delivery timing.  NST today reactive .Baseline 130, moderate variability, accelerations present. Decelerations absent. No contractions noted.    Pt to follow up 1 wk for ROB and NST.    Alise Appl, CNM

## 2023-07-16 NOTE — Patient Instructions (Signed)
 Braxton Hicks Contractions: Self-Care Braxton Hicks contractions may feel like labor contractions, but they're like practice for real labor. They can start when you're halfway through your pregnancy. During the last 2 months of your pregnancy, these contractions may happen more and hurt more. However, they aren't a sign that you're in real labor. What are Deberah Pelton contractions? Braxton Hicks contractions make your belly muscles tighten. They aren't real labor because they don't make your cervix, which is the lowest part of your uterus, open and thin. This tightening of your belly before labor starts can also be called false labor. How to tell the difference between true labor and false labor True labor contractions Last 60-90 seconds. Happen on a pattern. Get stronger and last longer over time. Don't go away when you: Walk. Change positions. Rest. Discomfort often begins in the back and then moves to the front. The cervix opens and thins. False labor contractions Are weak and last a short time. Don't happen on a pattern. Happen farther apart than true labor. May go away when you: Walk. Change positions. Rest. May be noticed more at the end of the day. Discomfort is often only felt in the front of the belly. The cervix usually does not open or thin. Sometimes, the only way to tell if you're in true labor is for your health care provider to check your cervix. Your provider will do a physical exam and may monitor your contractions. If you're in true labor, your provider may send you home with instructions about when to return to the hospital or may send you to the hospital right away. Follow these instructions at home:  Take your medicines only as told. Drink more fluids as told. Dehydration may cause these contractions. Dehydration is when there's not enough water in your body. If Braxton Hicks contractions are making you uncomfortable: Change your position or activity. Sit and  rest in a tub of warm water. Do slow and deep breathing several times an hour. Keep all follow-up visits. Your provider will need to check your health and your baby's health. Contact a health care provider if: Your contractions become: Stronger. More regular. Closer together. You have mucus from your vagina that has blood in it. Get help right away if: You feel your baby moving less than usual. You have any amount of fluid that flows from your vagina without stopping. You have signs or symptoms of labor before 37 weeks of pregnancy, such as: Contractions that are 5 minutes or less apart, or that get stronger and last longer. Sudden, sharp pain in your belly or lower back. These symptoms may be an emergency. Call 911 right away. Do not wait to see if the symptoms will go away. Do not drive yourself to the hospital. This information is not intended to replace advice given to you by your health care provider. Make sure you discuss any questions you have with your health care provider. Document Revised: 10/20/2022 Document Reviewed: 10/20/2022 Elsevier Patient Education  2024 [employer].

## 2023-07-17 ENCOUNTER — Encounter: Payer: Self-pay | Admitting: Certified Nurse Midwife

## 2023-07-18 LAB — STREP GP B NAA: Strep Gp B NAA: NEGATIVE

## 2023-07-19 ENCOUNTER — Other Ambulatory Visit: Payer: Self-pay

## 2023-07-19 ENCOUNTER — Encounter: Payer: Self-pay | Admitting: Obstetrics & Gynecology

## 2023-07-19 ENCOUNTER — Observation Stay
Admission: EM | Admit: 2023-07-19 | Discharge: 2023-07-19 | Disposition: A | Discharge status: Home / Self Care | Attending: Advanced Practice Midwife | Admitting: Advanced Practice Midwife

## 2023-07-19 DIAGNOSIS — Z794 Long term (current) use of insulin: Secondary | ICD-10-CM | POA: Diagnosis not present

## 2023-07-19 DIAGNOSIS — O0993 Supervision of high risk pregnancy, unspecified, third trimester: Principal | ICD-10-CM

## 2023-07-19 DIAGNOSIS — Z3A36 36 weeks gestation of pregnancy: Secondary | ICD-10-CM | POA: Insufficient documentation

## 2023-07-19 DIAGNOSIS — Z87891 Personal history of nicotine dependence: Secondary | ICD-10-CM | POA: Diagnosis not present

## 2023-07-19 DIAGNOSIS — Z7982 Long term (current) use of aspirin: Secondary | ICD-10-CM | POA: Insufficient documentation

## 2023-07-19 DIAGNOSIS — O2243 Hemorrhoids in pregnancy, third trimester: Principal | ICD-10-CM | POA: Insufficient documentation

## 2023-07-19 MED ORDER — HYDROCORTISONE (PERIANAL) 2.5 % EX CREA
1.0000 | TOPICAL_CREAM | Freq: Two times a day (BID) | CUTANEOUS | 0 refills | Status: DC
Start: 2023-07-19 — End: 2023-08-13

## 2023-07-19 NOTE — OB Triage Note (Signed)
 Pt being discharged by [facility], medication for hemorrhoids has been sent to pharmacy. Discharge instructions reviewed and pt verbalized understanding. Sent home stable and ambulatory with her mother. Labor precautions reviewed.
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
## 2023-07-19 NOTE — OB Triage Note (Signed)
 33 y.o G3P2 presents to L&D triage w a c/o hemorrhoids and lower left abdominal pain. She notes that she called about her hemorrhoids on Friday but her medicine was never sent in. She denies vaginal bleeding, endorses fetal movement, and reports taking her insuline as prescribed for GDM. [place] CNM aware of pt arrival, fetal monitors applied and assessing. Vitals wnl.

## 2023-07-20 ENCOUNTER — Other Ambulatory Visit: Payer: Self-pay

## 2023-07-20 ENCOUNTER — Telehealth: Payer: Self-pay

## 2023-07-20 ENCOUNTER — Encounter: Payer: Self-pay | Admitting: Obstetrics and Gynecology

## 2023-07-20 DIAGNOSIS — O99213 Obesity complicating pregnancy, third trimester: Secondary | ICD-10-CM

## 2023-07-20 LAB — CERVICOVAGINAL ANCILLARY ONLY
Chlamydia: NEGATIVE
Comment: NEGATIVE
Comment: NORMAL
Neisseria Gonorrhea: NEGATIVE

## 2023-07-20 NOTE — Telephone Encounter (Signed)
 Laura [name]

## 2023-07-20 NOTE — Telephone Encounter (Signed)
 Chart reviewed. Patient seen in L&D. Rx for [facility] sent to pharmacy.

## 2023-07-22 ENCOUNTER — Ambulatory Visit
Admission: RE | Admit: 2023-07-22 | Discharge: 2023-07-22 | Disposition: A | Discharge status: Home / Self Care | Source: Ambulatory Visit | Attending: Licensed Practical Nurse | Admitting: Licensed Practical Nurse

## 2023-07-22 DIAGNOSIS — O99213 Obesity complicating pregnancy, third trimester: Secondary | ICD-10-CM | POA: Insufficient documentation

## 2023-07-23 ENCOUNTER — Ambulatory Visit (INDEPENDENT_AMBULATORY_CARE_PROVIDER_SITE_OTHER): Admitting: Licensed Practical Nurse

## 2023-07-23 ENCOUNTER — Encounter: Payer: Self-pay | Admitting: Licensed Practical Nurse

## 2023-07-23 ENCOUNTER — Other Ambulatory Visit

## 2023-07-23 VITALS — BP 132/85 | HR 98 | Wt 232.6 lb

## 2023-07-23 DIAGNOSIS — Z0289 Encounter for other administrative examinations: Secondary | ICD-10-CM

## 2023-07-23 DIAGNOSIS — O24113 Pre-existing diabetes mellitus, type 2, in pregnancy, third trimester: Secondary | ICD-10-CM | POA: Diagnosis not present

## 2023-07-23 DIAGNOSIS — Z3A37 37 weeks gestation of pregnancy: Secondary | ICD-10-CM | POA: Diagnosis not present

## 2023-07-23 DIAGNOSIS — E119 Type 2 diabetes mellitus without complications: Secondary | ICD-10-CM | POA: Diagnosis not present

## 2023-07-23 DIAGNOSIS — O0993 Supervision of high risk pregnancy, unspecified, third trimester: Secondary | ICD-10-CM

## 2023-07-23 DIAGNOSIS — Z3490 Encounter for supervision of normal pregnancy, unspecified, unspecified trimester: Secondary | ICD-10-CM

## 2023-07-23 LAB — POCT URINALYSIS DIPSTICK
Bilirubin, UA: NEGATIVE
Blood, UA: NEGATIVE
Glucose, UA: NEGATIVE
Ketones, UA: NEGATIVE
Leukocytes, UA: NEGATIVE
Nitrite, UA: NEGATIVE
Protein, UA: POSITIVE — AB
Spec Grav, UA: 1.015 (ref 1.010–1.025)
Urobilinogen, UA: 0.2 U/dL
pH, UA: 7 (ref 5.0–8.0)

## 2023-07-23 NOTE — Progress Notes (Signed)
    Return Prenatal Note   Subjective   33 y.o. [id number] at [redacted]w[redacted]d presents for this follow-up prenatal visit.  Patient Here with her mother, feels ready to be done with pregnancy, has some swelling in her ankles.  Patient reports: Movement: Present Contractions: Irritability  Objective   Flow sheet Vitals: Pulse Rate: 98 BP: 132/85 Fetal Heart Rate (bpm): 135 Presentation: Vertex Total weight gain: 32 lb 9.6 oz (14.8 kg)  General Appearance  No acute distress, well appearing, and well nourished Pulmonary   Normal work of breathing Neurologic   Alert and oriented to person, place, and time Psychiatric   Mood and affect within normal limits  Assessment/Plan   Plan  33 y.o. [id number] at [redacted]w[redacted]d presents for follow-up OB visit. Reviewed prenatal record including previous visit note.  Type 2 diabetes mellitus (HCC) -fasting's >95 but mostly less than 100,  -IOL scheduled May 8 at 0800  Supervision of high-risk pregnancy -family will watch her children while in labor, they have a lot of PP support -[name] will be her labor support -desires Mirena for contraception, partner to get vasectomy  -US  4/24 showed EFW 94.6%, AFI 6.0, 5/1 BPP 8/8, cephalic  -IOL May 8, anticipatory guidance given  -warning signs reveiwed      Orders Placed This Encounter  Procedures   POCT Urinalysis Dipstick   Return for IOL Jul 29 798.   No future appointments.  For next visit:   IOL may 8  orders placed      Darlyn Repsher [facility], CNM  05/02/254:55 PM

## 2023-07-23 NOTE — Assessment & Plan Note (Signed)
-  family will watch her children while in labor, they have a lot of PP support -Laura [name] will be her labor support -desires Mirena for contraception, partner to get vasectomy  -US  4/24 showed EFW 94.6%, AFI 6.0, 5/1 BPP 8/8, cephalic  -IOL May 8, anticipatory guidance given  -warning signs reveiwed

## 2023-07-23 NOTE — Assessment & Plan Note (Signed)
-  fasting's >95 but mostly less than 100,  -IOL scheduled May 8 at 0800

## 2023-07-29 ENCOUNTER — Inpatient Hospital Stay
Admission: RE | Admit: 2023-07-29 | Discharge: 2023-07-31 | DRG: 806 | Disposition: A | Discharge status: Home / Self Care | Payer: Self-pay | Attending: Licensed Practical Nurse | Admitting: Licensed Practical Nurse

## 2023-07-29 ENCOUNTER — Encounter: Payer: Self-pay | Admitting: Obstetrics

## 2023-07-29 ENCOUNTER — Inpatient Hospital Stay: Admitting: Anesthesiology

## 2023-07-29 ENCOUNTER — Other Ambulatory Visit: Payer: Self-pay

## 2023-07-29 DIAGNOSIS — O2412 Pre-existing diabetes mellitus, type 2, in childbirth: Principal | ICD-10-CM | POA: Diagnosis present

## 2023-07-29 DIAGNOSIS — O9903 Anemia complicating the puerperium: Secondary | ICD-10-CM | POA: Diagnosis not present

## 2023-07-29 DIAGNOSIS — Z794 Long term (current) use of insulin: Secondary | ICD-10-CM

## 2023-07-29 DIAGNOSIS — Z87891 Personal history of nicotine dependence: Secondary | ICD-10-CM | POA: Diagnosis not present

## 2023-07-29 DIAGNOSIS — Z3A38 38 weeks gestation of pregnancy: Secondary | ICD-10-CM | POA: Diagnosis not present

## 2023-07-29 DIAGNOSIS — E119 Type 2 diabetes mellitus without complications: Secondary | ICD-10-CM | POA: Diagnosis not present

## 2023-07-29 DIAGNOSIS — O99214 Obesity complicating childbirth: Secondary | ICD-10-CM | POA: Diagnosis present

## 2023-07-29 DIAGNOSIS — Z7984 Long term (current) use of oral hypoglycemic drugs: Secondary | ICD-10-CM

## 2023-07-29 DIAGNOSIS — Z3A36 36 weeks gestation of pregnancy: Secondary | ICD-10-CM

## 2023-07-29 DIAGNOSIS — Z3490 Encounter for supervision of normal pregnancy, unspecified, unspecified trimester: Secondary | ICD-10-CM | POA: Diagnosis present

## 2023-07-29 DIAGNOSIS — O9081 Anemia of the puerperium: Secondary | ICD-10-CM | POA: Diagnosis not present

## 2023-07-29 DIAGNOSIS — D62 Acute posthemorrhagic anemia: Secondary | ICD-10-CM | POA: Diagnosis not present

## 2023-07-29 DIAGNOSIS — O24919 Unspecified diabetes mellitus in pregnancy, unspecified trimester: Secondary | ICD-10-CM | POA: Diagnosis present

## 2023-07-29 DIAGNOSIS — O0993 Supervision of high risk pregnancy, unspecified, third trimester: Principal | ICD-10-CM

## 2023-07-29 LAB — CBC
HCT: 30.4 % — ABNORMAL LOW (ref 36.0–46.0)
Hemoglobin: 9.8 g/dL — ABNORMAL LOW (ref 12.0–15.0)
MCH: 25.7 pg — ABNORMAL LOW (ref 26.0–34.0)
MCHC: 32.2 g/dL (ref 30.0–36.0)
MCV: 79.6 fL — ABNORMAL LOW (ref 80.0–100.0)
Platelets: 130 10*3/uL — ABNORMAL LOW (ref 150–400)
RBC: 3.82 MIL/uL — ABNORMAL LOW (ref 3.87–5.11)
RDW: 15.9 % — ABNORMAL HIGH (ref 11.5–15.5)
WBC: 6.4 10*3/uL (ref 4.0–10.5)
nRBC: 0.5 % — ABNORMAL HIGH (ref 0.0–0.2)

## 2023-07-29 LAB — TYPE AND SCREEN
ABO/RH(D): A POS
Antibody Screen: NEGATIVE

## 2023-07-29 LAB — GLUCOSE, CAPILLARY
Glucose-Capillary: 102 mg/dL — ABNORMAL HIGH (ref 70–99)
Glucose-Capillary: 108 mg/dL — ABNORMAL HIGH (ref 70–99)
Glucose-Capillary: 62 mg/dL — ABNORMAL LOW (ref 70–99)
Glucose-Capillary: 90 mg/dL (ref 70–99)

## 2023-07-29 LAB — RPR: RPR Ser Ql: NONREACTIVE

## 2023-07-29 MED ORDER — LIDOCAINE HCL (PF) 1 % IJ SOLN
INTRAMUSCULAR | Status: DC | PRN
Start: 2023-07-29 — End: 2023-07-29
  Administered 2023-07-29: 3 mL

## 2023-07-29 MED ORDER — FENTANYL-BUPIVACAINE-NACL 0.5-0.125-0.9 MG/250ML-% EP SOLN
12.0000 mL/h | EPIDURAL | Status: DC | PRN
  Administered 2023-07-29: 12 mL/h via EPIDURAL

## 2023-07-29 MED ORDER — LACTATED RINGERS IV SOLN
INTRAVENOUS | Status: DC
Start: 2023-07-29 — End: 2023-07-30

## 2023-07-29 MED ORDER — OXYTOCIN-SODIUM CHLORIDE 30-0.9 UT/500ML-% IV SOLN
2.5000 [IU]/h | INTRAVENOUS | Status: DC
  Administered 2023-07-29: 2.5 [IU]/h via INTRAVENOUS

## 2023-07-29 MED ORDER — ONDANSETRON HCL 4 MG/2ML IJ SOLN: 4.0000 mg | Freq: Four times a day (QID) | INTRAMUSCULAR | Status: DC | PRN

## 2023-07-29 MED ORDER — LIDOCAINE-EPINEPHRINE (PF) 1.5 %-1:200000 IJ SOLN
INTRAMUSCULAR | Status: DC | PRN
  Administered 2023-07-29: 3 mL via EPIDURAL

## 2023-07-29 MED ORDER — HYDROXYZINE HCL 25 MG PO TABS: 50.0000 mg | ORAL_TABLET | Freq: Four times a day (QID) | ORAL | Status: DC | PRN

## 2023-07-29 MED ORDER — OXYTOCIN 10 UNIT/ML IJ SOLN
INTRAMUSCULAR | Status: DC
Start: 2023-07-29 — End: 2023-07-29
  Filled 2023-07-29: qty 2

## 2023-07-29 MED ORDER — LACTATED RINGERS IV SOLN: 500.0000 mL | INTRAVENOUS | Status: DC | PRN

## 2023-07-29 MED ORDER — BUPIVACAINE HCL (PF) 0.25 % IJ SOLN
INTRAMUSCULAR | Status: DC | PRN
  Administered 2023-07-29: 4 mL via EPIDURAL
  Administered 2023-07-29: 3 mL via EPIDURAL

## 2023-07-29 MED ORDER — PHENYLEPHRINE 80 MCG/ML (10ML) SYRINGE FOR IV PUSH (FOR BLOOD PRESSURE SUPPORT): 80.0000 ug | PREFILLED_SYRINGE | INTRAVENOUS | Status: DC | PRN

## 2023-07-29 MED ORDER — MISOPROSTOL 200 MCG PO TABS
ORAL_TABLET | ORAL | Status: AC
  Administered 2023-07-29: 800 ug via VAGINAL
  Filled 2023-07-29: qty 4

## 2023-07-29 MED ORDER — MISOPROSTOL 25 MCG QUARTER TABLET
25.0000 ug | ORAL_TABLET | Freq: Once | ORAL | Status: AC
  Administered 2023-07-29: 25 ug via ORAL
  Filled 2023-07-29: qty 1

## 2023-07-29 MED ORDER — FENTANYL CITRATE (PF) 100 MCG/2ML IJ SOLN
50.0000 ug | INTRAMUSCULAR | Status: DC | PRN
  Administered 2023-07-30: 100 ug via INTRAVENOUS
  Filled 2023-07-29: qty 2

## 2023-07-29 MED ORDER — SOD CITRATE-CITRIC ACID 500-334 MG/5ML PO SOLN: 30.0000 mL | ORAL | Status: DC | PRN

## 2023-07-29 MED ORDER — LIDOCAINE HCL (PF) 1 % IJ SOLN
INTRAMUSCULAR | Status: DC
Start: 2023-07-29 — End: 2023-07-29
  Filled 2023-07-29: qty 30

## 2023-07-29 MED ORDER — OXYTOCIN-SODIUM CHLORIDE 30-0.9 UT/500ML-% IV SOLN
1.0000 m[IU]/min | INTRAVENOUS | Status: DC
  Administered 2023-07-29: 2 m[IU]/min via INTRAVENOUS
  Filled 2023-07-29: qty 500

## 2023-07-29 MED ORDER — EPHEDRINE 5 MG/ML INJ: 10.0000 mg | INTRAVENOUS | Status: DC | PRN

## 2023-07-29 MED ORDER — MISOPROSTOL 25 MCG QUARTER TABLET: 50.0000 ug | ORAL_TABLET | ORAL | Status: DC | PRN

## 2023-07-29 MED ORDER — DIPHENHYDRAMINE HCL 50 MG/ML IJ SOLN: 12.5000 mg | INTRAMUSCULAR | Status: DC | PRN

## 2023-07-29 MED ORDER — AMMONIA AROMATIC IN INHA
RESPIRATORY_TRACT | Status: AC
  Filled 2023-07-29: qty 10

## 2023-07-29 MED ORDER — FENTANYL-BUPIVACAINE-NACL 0.5-0.125-0.9 MG/250ML-% EP SOLN
EPIDURAL | Status: AC
  Filled 2023-07-29: qty 250

## 2023-07-29 MED ORDER — LACTATED RINGERS IV SOLN
500.0000 mL | Freq: Once | INTRAVENOUS | Status: AC
  Administered 2023-07-29: 500 mL via INTRAVENOUS

## 2023-07-29 MED ORDER — MISOPROSTOL 50MCG HALF TABLET
50.0000 ug | ORAL_TABLET | Freq: Once | ORAL | Status: AC
  Administered 2023-07-29: 50 ug via VAGINAL
  Filled 2023-07-29: qty 1

## 2023-07-29 MED ORDER — TERBUTALINE SULFATE 1 MG/ML IJ SOLN: 0.2500 mg | Freq: Once | INTRAMUSCULAR | Status: DC | PRN

## 2023-07-29 MED ORDER — OXYTOCIN BOLUS FROM INFUSION
333.0000 mL | Freq: Once | INTRAVENOUS | Status: AC
  Administered 2023-07-29: 333 mL via INTRAVENOUS

## 2023-07-29 MED ORDER — LIDOCAINE HCL (PF) 1 % IJ SOLN: 30.0000 mL | INTRAMUSCULAR | Status: DC | PRN

## 2023-07-29 NOTE — Progress Notes (Signed)
 Called emergently to the delivery room at the request of L. Dominic, CNM to assist with a shoulder dystocia. Fetal head noted to be delivered with L. Dominic and nurses performing shoulder dystocia maneuvers with McRoberts and suprapubic pressure. Fetal right shoulder was anterior and left shoulder was posterior. I reached for posterior shoulder and was unable to reach left wrist to deliver the posterior arm, but was able to gently maneuver posterior arm from AP diameter to oblique. Gentle downward traction on fetal head allowed for anterior shoulder to be visualized and fetal rotation to OA. Infant's left arm delivered and the infant was delivered and placed on maternal abdomen and attended to by peds. Infant apgars 7/8. Infant was moving both hands and arms after delivery.   Auston Left, CNM 07/29/2023
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
## 2023-07-29 NOTE — Progress Notes (Signed)
 Laura [name] is a 33 y.o. G3P2002 at [redacted]w[redacted]d by LMP admitted for induction of labor due to Diabetes.  Subjective: Comfortable with epidural  Objective: BP 129/66   Pulse 89   Temp 98.1 F (36.7 C) (Oral)   Resp 18   Ht 5\' 1"  (1.549 m)   Wt 107 kg   LMP 11/04/2022 (Exact Date)   SpO2 96%   BMI 44.59 kg/m  No intake/output data recorded. Total I/O In: 240 [P.O.:240] Out: -   FHT:  FHR: 150 bpm, variability: moderate,  accelerations:  Present,  decelerations:  Present occasional variable UC:   irregular, every 2-5 minutes SVE:   Dilation: 4 Effacement (%): 50 Station: -3 Exam by:: Laura [name] CNM  Labs: Lab Results  Component Value Date   WBC 6.4 07/29/2023   HGB 9.8 (Laura) 07/29/2023   HCT 30.4 (Laura) 07/29/2023   MCV 79.6 (Laura) 07/29/2023   PLT 130 (Laura) 07/29/2023    Assessment / Plan: IOL d/t T2DM in early labor  Labor: will start Pitocin , AROM when appropriate  T2DM: see consult note, glucose at 0911 108  Fetal Wellbeing:  Category I and II Pain Control:  Epidural I/D:  GBS negative, Membranes intact  Anticipated MOD:  NSVD  Berkley [name] Laura Callins, CNM 07/29/2023, 2:28 PM

## 2023-07-29 NOTE — Anesthesia Procedure Notes (Addendum)
 Epidural Patient location during procedure: OB Start time: 07/29/2023 1:26 PM End time: 07/29/2023 1:28 PM  Staffing Anesthesiologist: Nancey Awkward, MD Resident/CRNA: Philippe Brazen, CRNA Performed: anesthesiologist   Preanesthetic Checklist Completed: patient identified, IV checked, site marked, risks and benefits discussed, surgical consent, monitors and equipment checked and pre-op evaluation  Epidural Patient position: sitting Prep: ChloraPrep Patient monitoring: heart rate, continuous pulse ox and blood pressure Approach: midline Location: L3-L4 Injection technique: LOR saline  Needle:  Needle type: Tuohy  Needle gauge: 17 G Needle length: 9 cm and 9 Needle insertion depth: 9 cm Catheter type: closed end flexible Catheter size: 19 Gauge Catheter at skin depth: 14 cm Test dose: negative and 1.5% lidocaine  with Epi 1:200 K  Assessment Events: blood not aspirated, no cerebrospinal fluid, injection not painful, no injection resistance, no paresthesia and negative IV test  Additional Notes 1 attempt Pt. Evaluated and documentation done after procedure finished. Patient identified. Risks/Benefits/Options discussed with patient including but not limited to bleeding, infection, nerve damage, paralysis, failed block, incomplete pain control, headache, blood pressure changes, nausea, vomiting, reactions to medication both or allergic, itching and postpartum back pain. Confirmed with bedside nurse the patient's most recent platelet count. Confirmed with patient that they are not currently taking any anticoagulation, have any bleeding history or any family history of bleeding disorders. Patient expressed understanding and wished to proceed. All questions were answered. Sterile technique was used throughout the entire procedure. Please see nursing notes for vital signs. Test dose was given through epidural catheter and negative prior to continuing to dose epidural or start infusion.  Warning signs of high block given to the patient including shortness of breath, tingling/numbness in hands, complete motor block, or any concerning symptoms with instructions to call for help. Patient was given instructions on fall risk and not to get out of bed. All questions and concerns addressed with instructions to call with any issues or inadequate analgesia.    Patient tolerated the insertion well without immediate complications.Reason for block:procedure for pain

## 2023-07-29 NOTE — Inpatient Diabetes Management (Signed)
 ADA Standards of Care 2023 Diabetes in Pregnancy Target Glucose Ranges:  Fasting: 70 - 95 mg/dL 1 hr postprandial:  409 - 140mg /dL (from first bite of meal) 2 hr postprandial:  100 - 120 mg/dL (from first bit of meal)    Latest Reference Range & Units 07/29/23 09:11  Glucose-Capillary 70 - 99 mg/dL 811 (H)  (H): Data is abnormally high   Admit with:  IOL secondary to T2DM   History: DM2  Home DM Meds: Glyburide  5 mg QAM/ 7.5 mg at Bedtime        Lantus  8 units at Bedtime  Current Orders: CBG checks Q4H    Note CBG checks started this AM  Will follow    --Will follow patient during hospitalization--  Langston Pippins RN, MSN, CDCES Diabetes Coordinator Inpatient Glycemic Control Team Team Pager: [phone] (8a-5p)

## 2023-07-29 NOTE — Progress Notes (Signed)
 Patient ID: Laura [name], female   DOB: [birth year], 33 y.o.   MRN: [id number] Chart and strip reviewed . Updated with CNMDominic now .  Anticipate SVD

## 2023-07-29 NOTE — Anesthesia Preprocedure Evaluation (Signed)
 Anesthesia Evaluation  Patient identified by MRN, date of birth, ID band  Reviewed: Allergy & Precautions, H&P , NPO status , Patient's Chart, lab work & pertinent test results  Airway Mallampati: II       Dental no notable dental hx.    Pulmonary former smoker   Pulmonary exam normal        Cardiovascular negative cardio ROS Normal cardiovascular exam     Neuro/Psych  Headaches  negative psych ROS   GI/Hepatic negative GI ROS, Neg liver ROS,,,  Endo/Other  diabetes, Poorly Controlled, Type 2    Renal/GU negative Renal ROS     Musculoskeletal   Abdominal   Peds  Hematology negative hematology ROS (+)   Anesthesia Other Findings   Reproductive/Obstetrics (+) Pregnancy                             Anesthesia Physical Anesthesia Plan  ASA: 2  Anesthesia Plan: Epidural   Post-op Pain Management:    Induction:   PONV Risk Score and Plan:   Airway Management Planned:   Additional Equipment:   Intra-op Plan:   Post-operative Plan:   Informed Consent:   Plan Discussed with: Anesthesiologist and CRNA  Anesthesia Plan Comments:        Anesthesia Quick Evaluation

## 2023-07-29 NOTE — Progress Notes (Signed)
 Laura [name] is a 33 y.o. G3P2002 at [redacted]w[redacted]d by LMP admitted for induction of labor due to Diabetes.  Subjective: Comfortable with epidural, her husband and mother are at her bedside.   Objective: BP 111/61   Pulse 77   Temp 98.1 F (36.7 C) (Oral)   Resp 18   Ht 5\' 1"  (1.549 m)   Wt 107 kg   LMP 11/04/2022 (Exact Date)   SpO2 99%   BMI 44.59 kg/m  No intake/output data recorded. Total I/O In: 240 [P.O.:240] Out: -   FHT:  FHR: 155 bpm, variability: moderate,  accelerations:  Present,  decelerations:  Present occasional variable  UC:   regular, every 1-3 minutes SVE:   Dilation: 5 Effacement (%): 50 Station: -3 Exam by:: L Jashay Roddy CNM  Pitocin  at 6 milli-units   Labs: Lab Results  Component Value Date   WBC 6.4 07/29/2023   HGB 9.8 (L) 07/29/2023   HCT 30.4 (L) 07/29/2023   MCV 79.6 (L) 07/29/2023   PLT 130 (L) 07/29/2023    Assessment / Plan: IOL in early labor   Labor: Progressing normally, AROM when appropriate  T2DM: blood glucose at 1431 90  Fetal Wellbeing:  Category II Pain Control:  Epidural I/D:  GBS negative, membranes intact  Anticipated MOD:  NSVD  Laura [name], CNM 07/29/2023, 6:02 PM

## 2023-07-30 ENCOUNTER — Inpatient Hospital Stay

## 2023-07-30 ENCOUNTER — Encounter: Payer: Self-pay | Admitting: Obstetrics and Gynecology

## 2023-07-30 LAB — GLUCOSE, CAPILLARY
Glucose-Capillary: 129 mg/dL — ABNORMAL HIGH (ref 70–99)
Glucose-Capillary: 139 mg/dL — ABNORMAL HIGH (ref 70–99)
Glucose-Capillary: 107 mg/dL — ABNORMAL HIGH (ref 70–99)
Glucose-Capillary: 93 mg/dL (ref 70–99)

## 2023-07-30 LAB — CBC
HCT: 27.7 % — ABNORMAL LOW (ref 36.0–46.0)
Hemoglobin: 9.1 g/dL — ABNORMAL LOW (ref 12.0–15.0)
MCH: 25.9 pg — ABNORMAL LOW (ref 26.0–34.0)
MCHC: 32.9 g/dL (ref 30.0–36.0)
MCV: 78.9 fL — ABNORMAL LOW (ref 80.0–100.0)
Platelets: 124 10*3/uL — ABNORMAL LOW (ref 150–400)
RBC: 3.51 MIL/uL — ABNORMAL LOW (ref 3.87–5.11)
RDW: 16.2 % — ABNORMAL HIGH (ref 11.5–15.5)
WBC: 12.9 10*3/uL — ABNORMAL HIGH (ref 4.0–10.5)
nRBC: 0.2 % (ref 0.0–0.2)

## 2023-07-30 MED ORDER — SIMETHICONE 80 MG PO CHEW: 80.0000 mg | CHEWABLE_TABLET | ORAL | Status: DC | PRN

## 2023-07-30 MED ORDER — METHYLERGONOVINE MALEATE 0.2 MG PO TABS: 0.2000 mg | ORAL_TABLET | ORAL | Status: DC | PRN

## 2023-07-30 MED ORDER — COCONUT OIL OIL: 1.0000 | TOPICAL_OIL | Status: DC | PRN

## 2023-07-30 MED ORDER — SODIUM CHLORIDE 0.9 % IV BOLUS: 500.0000 mL | Freq: Once | INTRAVENOUS | Status: DC | PRN

## 2023-07-30 MED ORDER — EPINEPHRINE 0.3 MG/0.3ML IJ SOAJ: 0.3000 mg | Freq: Once | INTRAMUSCULAR | Status: DC | PRN

## 2023-07-30 MED ORDER — ONDANSETRON HCL 4 MG/2ML IJ SOLN: 4.0000 mg | INTRAMUSCULAR | Status: DC | PRN

## 2023-07-30 MED ORDER — OXYCODONE HCL 5 MG PO TABS: 10.0000 mg | ORAL_TABLET | ORAL | Status: DC | PRN

## 2023-07-30 MED ORDER — PRENATAL MULTIVITAMIN CH
1.0000 | ORAL_TABLET | Freq: Every day | ORAL | Status: DC
  Administered 2023-07-30 – 2023-07-31 (×2): 1 via ORAL
  Filled 2023-07-30 (×2): qty 1

## 2023-07-30 MED ORDER — METHYLERGONOVINE MALEATE 0.2 MG/ML IJ SOLN
0.2000 mg | INTRAMUSCULAR | Status: DC | PRN
  Administered 2023-07-30: 0.2 mg via INTRAMUSCULAR
  Filled 2023-07-30: qty 1

## 2023-07-30 MED ORDER — DIPHENHYDRAMINE HCL 50 MG/ML IJ SOLN: 25.0000 mg | Freq: Once | INTRAMUSCULAR | Status: DC | PRN

## 2023-07-30 MED ORDER — ZOLPIDEM TARTRATE 5 MG PO TABS: 5.0000 mg | ORAL_TABLET | Freq: Every evening | ORAL | Status: DC | PRN

## 2023-07-30 MED ORDER — DIPHENHYDRAMINE HCL 25 MG PO CAPS: 25.0000 mg | ORAL_CAPSULE | Freq: Four times a day (QID) | ORAL | Status: DC | PRN

## 2023-07-30 MED ORDER — WITCH HAZEL-GLYCERIN EX PADS
1.0000 | MEDICATED_PAD | CUTANEOUS | Status: DC | PRN
  Filled 2023-07-30: qty 100

## 2023-07-30 MED ORDER — ALBUTEROL SULFATE (2.5 MG/3ML) 0.083% IN NEBU: 2.5000 mg | INHALATION_SOLUTION | Freq: Once | RESPIRATORY_TRACT | Status: DC | PRN

## 2023-07-30 MED ORDER — DIBUCAINE (PERIANAL) 1 % EX OINT: 1.0000 | TOPICAL_OINTMENT | CUTANEOUS | Status: DC | PRN

## 2023-07-30 MED ORDER — OXYCODONE HCL 5 MG PO TABS
5.0000 mg | ORAL_TABLET | ORAL | Status: DC | PRN
  Administered 2023-07-30 (×3): 5 mg via ORAL
  Filled 2023-07-30 (×3): qty 1

## 2023-07-30 MED ORDER — ONDANSETRON HCL 4 MG PO TABS: 4.0000 mg | ORAL_TABLET | ORAL | Status: DC | PRN

## 2023-07-30 MED ORDER — BENZOCAINE-MENTHOL 20-0.5 % EX AERO
1.0000 | INHALATION_SPRAY | CUTANEOUS | Status: DC | PRN
  Filled 2023-07-30: qty 56

## 2023-07-30 MED ORDER — CEFAZOLIN SODIUM-DEXTROSE 2-4 GM/100ML-% IV SOLN
2.0000 g | Freq: Once | INTRAVENOUS | Status: AC
  Administered 2023-07-30: 2 g via INTRAVENOUS
  Filled 2023-07-30: qty 100

## 2023-07-30 MED ORDER — IBUPROFEN 600 MG PO TABS
600.0000 mg | ORAL_TABLET | Freq: Four times a day (QID) | ORAL | Status: DC
Start: 2023-07-30 — End: 2023-07-31
  Administered 2023-07-30 – 2023-07-31 (×5): 600 mg via ORAL
  Filled 2023-07-30 (×5): qty 1

## 2023-07-30 MED ORDER — IRON SUCROSE 500 MG IVPB - SIMPLE MED
500.0000 mg | Freq: Once | INTRAVENOUS | Status: AC
  Administered 2023-07-30: 500 mg via INTRAVENOUS
  Filled 2023-07-30: qty 500

## 2023-07-30 MED ORDER — SODIUM CHLORIDE 0.9 % IV SOLN: INTRAVENOUS | Status: DC | PRN

## 2023-07-30 MED ORDER — METHYLPREDNISOLONE SODIUM SUCC 125 MG IJ SOLR: 125.0000 mg | Freq: Once | INTRAMUSCULAR | Status: DC | PRN

## 2023-07-30 MED ORDER — ACETAMINOPHEN 500 MG PO TABS
1000.0000 mg | ORAL_TABLET | Freq: Four times a day (QID) | ORAL | Status: DC
  Administered 2023-07-30 – 2023-07-31 (×6): 1000 mg via ORAL
  Filled 2023-07-30 (×6): qty 2

## 2023-07-30 MED ORDER — IBUPROFEN 600 MG PO TABS
ORAL_TABLET | ORAL | Status: AC
  Filled 2023-07-30: qty 1

## 2023-07-30 MED ORDER — DOCUSATE SODIUM 100 MG PO CAPS
100.0000 mg | ORAL_CAPSULE | Freq: Two times a day (BID) | ORAL | Status: DC
  Administered 2023-07-30 – 2023-07-31 (×3): 100 mg via ORAL
  Filled 2023-07-30 (×3): qty 1

## 2023-07-30 NOTE — Progress Notes (Signed)
 Patient ID: Laura [name], female   DOB: February 05, 1991, 33 y.o.   MRN: [id number]  RN reported uterus was 2 above U and deviated to left.  This CNM in to  assess pt. Uterus firm 2 above U, bimanual exam done, small  blood clot removed from uterus.  Fentanyl  given IV and then another sweep attempted, medium sized clot removed,  uterine wall not smooth. Bleeding and vital signs stable. Dr Ellouise Haagensen called, given that pt is stable, she does not need any further uterine exploration. Ancef ordered. Will give methergine.  QBL with uterine exploration  total QBL now .   Anice Kerbs, CNM  [facility]  07/30/2023 1:17 AM

## 2023-07-30 NOTE — Lactation Note (Signed)
 This note was copied from a baby's chart. Lactation Consultation Note  Patient Name: Laura [name] [id number] Date: 07/30/2023 Age:35 hours Reason for consult: Follow-up assessment;Early term 37-38.6wks   Maternal Data    Feeding Mother's Current Feeding Choice: Breast Milk and Donor Milk Nipple Type: Dr. Michelene Ahmadi Preemie BS 81 per Syler Lindsay Rho RN, baby latched easily to left breast after hand expression, nursed x 20 min.per mom LATCH Score Latch: Grasps breast easily, tongue down, lips flanged, rhythmical sucking.  Audible Swallowing: A few with stimulation  Type of Nipple: Everted at rest and after stimulation  Comfort (Breast/Nipple): Soft / non-tender  Hold (Positioning): Assistance needed to correctly position infant at breast and maintain latch.  LATCH Score: 8   Lactation Tools Discussed/Used Tools: 13F feeding tube / Syringe Breast pump type: Double-Electric Breast Pump Pump Education: Setup, frequency, and cleaning;Milk Storage Reason for Pumping: hx of low milk supply, baby needing supplement re:  low blood sugar Pumping frequency: instructed to pump breasts after each breastfeeding if possible Pumped volume: 0.1 mL  Interventions Interventions: Assisted with latch;Adjust position;Support pillows;Education  Discharge Pump: DEBP;Personal  Consult Status Consult Status: Follow-up Date: 07/31/23 Follow-up type: In-patient    Leoma Raja 07/30/2023, 6:10 PM

## 2023-07-30 NOTE — Progress Notes (Signed)
 Subjective:   Laura [name] had a NSVB on 07/29/23. Her labor was complicated by shoulder dystocia and PPH with EBL of . Has had routine postpartum care.  Pt. Is eating, hydrating, and voiding regularly without difficulty. Has yet to have BM. She is breastfeeding. Baby has needed gel twice so far due to low sugars and if needed again will have to be transferred to the special care nursery. Reports small amount of vaginal bleeding, denies passing large blood clots. Has had cramping abdomen pain relieved with tylenol /ibuprofen  and roxi. Plans to use mirena IUD for contraception. Denies anxiety/depression symptoms. Endorses good support from partner and family. Denies feeling light headed or dizzy with ambulation.   Objective:  Vital signs in last 24 hours: Temp:  [97.5 F (36.4 C)-100.3 F (37.9 C)] 98.8 F (37.1 C) (05/09 1206) Pulse Rate:  [77-103] 83 (05/09 1206) Resp:  [16-20] 18 (05/09 1206) BP: (103-148)/(26-92) 132/81 (05/09 1206) SpO2:  [94 %-100 %] 98 % (05/09 0734)    General: NAD Pulmonary: no increased work of breathing Breasts: soft, non-tender, nipples without breakdown Abdomen: soft, non-tender Fundus: firm, midline, at umbilicus Lochia: light rubra, no clots Perineum: no erythema or foul odor discharge, minimal edema, laceration well approximated  Extremities: no edema, no erythema, no tenderness  Results for orders placed or performed during the hospital encounter of 07/29/23 (from the past 72 hours)  Type and screen     Status: None   Collection Time: 07/29/23  8:48 AM  Result Value Ref Range   ABO/RH(D) A POS    Antibody Screen NEG    Sample Expiration      08/01/2023,2359 Performed at [facility], [address] [place], [place] [address]   CBC     Status: Abnormal   Collection Time: 07/29/23  8:49 AM  Result Value Ref Range   WBC 6.4 4.0 - 10.5 K/uL   RBC 3.82 (L) 3.87 - 5.11 MIL/uL   Hemoglobin 9.8 (L) 12.0 - 15.0 g/dL   HCT 60.4  (L) 54.0 - 46.0 %   MCV 79.6 (L) 80.0 - 100.0 fL   MCH 25.7 (L) 26.0 - 34.0 pg   MCHC 32.2 30.0 - 36.0 g/dL   RDW 98.1 (H) 19.1 - 47.8 %   Platelets 130 (L) 150 - 400 K/uL   nRBC 0.5 (H) 0.0 - 0.2 %    Comment: Performed at [facility], [address] [place], [place] [address]  RPR     Status: None   Collection Time: 07/29/23  8:49 AM  Result Value Ref Range   RPR Ser Ql NON REACTIVE NON REACTIVE    Comment: Performed at [facility], [address] [place], [place] [address]  Glucose, capillary     Status: Abnormal   Collection Time: 07/29/23  9:11 AM  Result Value Ref Range   Glucose-Capillary 108 (H) 70 - 99 mg/dL    Comment: Glucose reference range applies only to samples taken after fasting for at least 8 hours.  Glucose, capillary     Status: None   Collection Time: 07/29/23  2:32 PM  Result Value Ref Range   Glucose-Capillary 90 70 - 99 mg/dL    Comment: Glucose reference range applies only to samples taken after fasting for at least 8 hours.  Glucose, capillary     Status: Abnormal   Collection Time: 07/29/23  7:09 PM  Result Value Ref Range   Glucose-Capillary 62 (L) 70 - 99 mg/dL  Comment: Glucose reference range applies only to samples taken after fasting for at least 8 hours.  Glucose, capillary     Status: Abnormal   Collection Time: 07/29/23 11:33 PM  Result Value Ref Range   Glucose-Capillary 102 (H) 70 - 99 mg/dL    Comment: Glucose reference range applies only to samples taken after fasting for at least 8 hours.  Glucose, capillary     Status: Abnormal   Collection Time: 07/30/23  3:42 AM  Result Value Ref Range   Glucose-Capillary 129 (H) 70 - 99 mg/dL    Comment: Glucose reference range applies only to samples taken after fasting for at least 8 hours.  CBC     Status: Abnormal   Collection Time: 07/30/23  6:32 AM  Result Value Ref Range   WBC 12.9 (H) 4.0 - 10.5 K/uL   RBC 3.51 (L) 3.87 - 5.11 MIL/uL   Hemoglobin 9.1 (L) 12.0 - 15.0  g/dL   HCT 16.1 (L) 09.6 - 04.5 %   MCV 78.9 (L) 80.0 - 100.0 fL   MCH 25.9 (L) 26.0 - 34.0 pg   MCHC 32.9 30.0 - 36.0 g/dL   RDW 40.9 (H) 81.1 - 91.4 %   Platelets 124 (L) 150 - 400 K/uL   nRBC 0.2 0.0 - 0.2 %    Comment: Performed at [facility], [address] [place], [place] [address]  Glucose, capillary     Status: Abnormal   Collection Time: 07/30/23  8:36 AM  Result Value Ref Range   Glucose-Capillary 139 (H) 70 - 99 mg/dL    Comment: Glucose reference range applies only to samples taken after fasting for at least 8 hours.  Glucose, capillary     Status: Abnormal   Collection Time: 07/30/23 12:01 PM  Result Value Ref Range   Glucose-Capillary 107 (H) 70 - 99 mg/dL    Comment: Glucose reference range applies only to samples taken after fasting for at least 8 hours.    Assessment:   33 y.o. G3P2002 1 day(s)  s/p NSVB with shoulder dystocia and PPH Breastfeeding Anemia secondary to acute blood loss- hemodynamically stable and symptomatic VSS Pain well controlled Type II DM  Plan:    IV iron infusion ordered due to PPH and hgb this morning of 9.1, discussed with patient who is in agreement with plan Blood Type --/--/A POS (05/08 0848) / Rubella 3.77 (11/06 1522) / Varicella Immune Rhogam not indicated Tdap/varicella/rubella to be offered before discharge if indicated Feeding plan breast, lactation support Encouraged to continue breastfeeding, BF education on latch, position changes, cluster feeding, hunger cues, lactogenesis II, milk supply Education given regarding options for contraception, as well as compatibility with breast feeding if applicable.  Patient plans on IUD for contraception. Continued routine postpartum care  Counseled on normal uterine involution and vaginal bleeding postpartum Consulted with diabetes educator regarding plan of care for postpartum blood sugar management. Recommendation to not start any medications at this time, if CBGS  are consistently above 150, consider starting metformin, and f/u with PCP to check A1c in three months. Anticipate discharge home tomorrow    Lou Rounds, FNP, CNM [facility] 07/30/2023, 2:33 PM

## 2023-07-30 NOTE — Progress Notes (Signed)
 Patient ID: Laura [name], female   DOB: October 27, 1990, 33 y.o.   MRN: [id number]  Rn reported pt had a gush of blood with recent fundal massage  Objective: BP 128/68   Pulse 88   Temp 97.9 F (36.6 C) (Oral)   Resp 18   Ht 5\' 1"  (1.549 m)   Wt 107 kg   LMP 11/04/2022 (Exact Date)   SpO2 95%   BMI 44.59 kg/m   Pt resting quietly, skin color pale, reports feeling tired and shaky denies light headedness, SOB or palpitations.  Fundal massage done by this CNM. Fundus firm midline 1 above U, small gush of blood with massage. Peripad changed, no further bleeding noted.  QBL since last pad change new total QBL now 1,476ml   Assessment/Plan -PPH -STAT US  ordered to assess uterus.   Anice Kerbs, CNM   [facility]  07/30/2023 1:59 AM

## 2023-07-30 NOTE — Inpatient Diabetes Management (Signed)
 Inpatient Diabetes Program Recommendations  AACE/ADA: New Consensus Statement on Inpatient Glycemic Control (2015)  Target Ranges:  Prepandial:   less than 140 mg/dL      Peak postprandial:   less than 180 mg/dL (1-2 hours)      Critically ill patients:  140 - 180 mg/dL    Latest Reference Range & Units 07/29/23 09:11 07/29/23 14:32 07/29/23 19:09 07/29/23 23:33 07/30/23 03:42 07/30/23 08:36 07/30/23 12:01  Glucose-Capillary 70 - 99 mg/dL 440 (H) 90 62 (L) 102 (H) 129 (H) 139 (H) 107 (H)  (H): Data is abnormally high (L): Data is abnormally low  Admit with:  IOL secondary to T2DM    History: DM2   Home DM Meds: Glyburide  5 mg QAM/ 7.5 mg at Bedtime                              Lantus  8 units at Bedtime   Current Orders: CBG checks Q4H        CBGs well controlled post-delivery  1. Would not continue Insulin  or Glyburide  at time of discharge  2. If pt needs any meds at all, could try Metformin 500 mg daily to start for Type 2 diabetes  Would only start the Metformin if pt has CBGs consistently >150 while here in hospital.  Otherwise, recommend follow up with PCP in 3 mos to check current A1c post-delivery  3. Please change her CBG checks to [facility]     --Will follow patient during hospitalization--  Langston Pippins RN, MSN, CDCES Diabetes Coordinator Inpatient Glycemic Control Team Team Pager: [phone] (8a-5p)      --Will follow patient during hospitalization--  Langston Pippins RN, MSN, CDCES Diabetes Coordinator Inpatient Glycemic Control Team Team Pager: [phone] (8a-5p)

## 2023-07-30 NOTE — Lactation Note (Signed)
 This note was copied from a baby's chart. Lactation Consultation Note  Patient Name: Laura [name] [id number] Date: 07/30/2023 Age:35 hours Reason for consult: Initial assessment;Early term 37-38.6wks;Other (Comment) (LGA, mom Type 2 DM)   Maternal Data Has patient been taught Hand Expression?: Yes Does the patient have breastfeeding experience prior to this delivery?: Yes How long did the patient breastfeed?: 3 mths  Feeding Mother's Current Feeding Choice: Breast Milk Baby nursing on right breast when room entered, noted baby pulls in chin, with chin pressure, latch is widened, swallows heard, nursed 12 min and then was able to latch and nurse approx on left, sl mucousy, mom attempted feeding DBM with 24 cal HMF  per bottle after, baby gaggy and unable to coordinate suck , 1-2 cc taken, left skin to skin on mom's chest as baby fussy.  LATCH Score Latch: Grasps breast easily, tongue down, lips flanged, rhythmical sucking. (pulls in chin)  Audible Swallowing: Spontaneous and intermittent  Type of Nipple: Everted at rest and after stimulation  Comfort (Breast/Nipple): Soft / non-tender  Hold (Positioning): Assistance needed to correctly position infant at breast and maintain latch.  LATCH Score: 9   Lactation Tools Discussed/Used  Once feeding attempts over, plan getting mom using DEBP after most feedings to assist with increasing milk supply and for supplement, LC name and no written on white board Interventions Interventions: Breast feeding basics reviewed;Assisted with latch;Skin to skin;Hand express;Adjust position;Support pillows;Coconut oil;Education  Discharge Pump: Personal WIC Program: Yes  Consult Status Consult Status: Follow-up Date: 07/30/23 Follow-up type: In-patient    Leoma Raja 07/30/2023, 10:49 AM

## 2023-07-30 NOTE — Anesthesia Postprocedure Evaluation (Signed)
 Anesthesia Post Note  Patient: Laura [name]  Procedure(s) Performed: AN AD HOC LABOR EPIDURAL  Patient location during evaluation: Mother Baby Anesthesia Type: Epidural Level of consciousness: awake and alert Pain management: pain level controlled Vital Signs Assessment: post-procedure vital signs reviewed and stable Respiratory status: spontaneous breathing, nonlabored ventilation and respiratory function stable Cardiovascular status: stable Postop Assessment: no headache, no backache and epidural receding Anesthetic complications: no   No notable events documented.   Last Vitals:  Vitals:   07/30/23 0615 07/30/23 0734  BP: 125/66 127/69  Pulse: 79 82  Resp: 18 20  Temp: 37.2 C 37.3 C  SpO2: 98% 98%    Last Pain:  Vitals:   07/30/23 0734  TempSrc: Oral  PainSc:                  Iven Mark

## 2023-07-30 NOTE — Progress Notes (Signed)
 CBG 139 this morning prior to breakfast. Pt had snacked on graham crackers and juice earlier waiting for breakfast tray. Will continue to monitor with Q4H CBG as ordered.

## 2023-07-30 NOTE — Lactation Note (Signed)
 This note was copied from a baby's chart. Lactation Consultation Note  Patient Name: Boy Jessika Caddick [id number] Date: 07/30/2023 Age:35 hours Reason for consult: Follow-up assessment;Early term 86-38.6wks   Maternal Data  Mom set up with DEBP after nursing baby, expressed 0.1 cc, colostrum container labeled.   Feeding Mother's Current Feeding Choice: Breast Milk and Donor Milk Nipple Type: Dr. Ramond Burnet  LATCH Score                    Lactation Tools Discussed/Used Tools: Pump Breast pump type: Double-Electric Breast Pump Pump Education: Setup, frequency, and cleaning;Milk Storage Reason for Pumping: hx of low milk supply, baby needing supplement re:  low blood sugar Pumping frequency: instructed to pump breasts after each breastfeeding if possible Pumped volume: 0.1 mL  Interventions Interventions: DEBP  Discharge Pump: DEBP  Consult Status Consult Status: Follow-up Date: 07/31/23 Follow-up type: In-patient    Leoma Raja 07/30/2023, 3:42 PM

## 2023-07-30 NOTE — Discharge Instructions (Signed)
 Call office if you have any of the following:  -Persistent headache or visual changes (possible high blood pressure) -Fever >101.0 F or chills (possible infection) -Breast concerns (engorgement, mastitis) -Excessive vaginal bleeding (soaking through more than one pad in 1 hr x 2 hr) -Incision drainage/redness/increased pain/warmth at site (possible infection)  -Leg pain or redness (possible blood clot) -Depression/anxiety increased symptoms 2 weeks after delivery  Activity & Hygiene: -Do not lift > 10 lbs for 6 weeks.  -No intercourse or tampons for 6 weeks.  -No swimming pools, hot tubs or tub baths- showers only for 6 weeks **No driving for 1-2 weeks or while taking pain medication after c-section  -It is normal to bleed for up to 6 weeks. You should not soak through more than 1 pad in 1 hour x 2 hours.  Breastfeeding: -Continue prenatal vitamin.  -Increase calories and fluids.  -Your milk will come in, in the next couple of days (right now it is colostrum).  -You may have a slight fever when your milk comes in, but it should go away on its own.   -If it does not, and rises above 101 F please call the doctor.  -You will also feel achy and your breasts will be firm. They will also start to leak.  *For breastfeeding concerns, the lactation consultant can be reached at [phone]  Not Breastfeeding: -Avoid breast stimulation and wear supportive bra -ICE helps decrease inflammation and pain -Express milk for comfort by hand, do not empty breast  Postpartum blues: -feelings of happy one minute and sad another minute are normal for the first few weeks. -if it gets worse please let your doctor know. It is very common!!

## 2023-07-31 LAB — GLUCOSE, CAPILLARY: Glucose-Capillary: 89 mg/dL (ref 70–99)

## 2023-07-31 NOTE — Progress Notes (Signed)
 Patient discharged home with family.  Discharge instructions, when to follow up, and prescriptions reviewed with patient.  Patient verbalized understanding. Patient will be escorted out by auxiliary.   Erlene Hawks, RN 07/31/23 @1315 

## 2023-07-31 NOTE — Discharge Summary (Signed)
 OB Discharge Summary     Patient Name: Laura [name] DOB: [birth year] MRN: [id number]  Date of admission: 07/29/2023 Delivering provider: Anice Kerbs, CNM Date of Delivery: 07/29/2023  Date of discharge: 07/31/2023  Admitting diagnosis: Encounter for induction of labor [Z34.90] Intrauterine pregnancy: [redacted]w[redacted]d     Secondary diagnosis: Type II Diabetes Mellitus     Discharge diagnosis: Term Pregnancy Delivered and Type 2 DM                                                                                                Post partum procedures:none  Augmentation: Pitocin  and Cytotec   Complications: shoulder dystocia  Hospital course:  Induction of Labor With Vaginal Delivery   33 y.o. yo G3P2002 at [redacted]w[redacted]d was admitted to the hospital 07/29/2023 for induction of labor.  Indication for induction: TYPE 2 DM.  Patient had an labor course complicated by NA Membrane Rupture Time/Date: 8:26 PM,07/29/2023  Delivery Method:Vaginal, Spontaneous Operative Delivery:N/A Episiotomy: None Lacerations:  1st degree;Vaginal;Periurethral Details of delivery can be found in separate delivery note.    Patient had a postpartum course complicated by none. Tolerating regular diet, pain is controlled with PO medication, ambulating and voiding without difficulty. Breastfeeding is going well per her report.   Patient is discharged home 07/31/23.  Newborn Data: Birth date:07/29/2023 Birth time:10:58 PM Gender:Female Living status:  Apgars:7 ,8  Weight:4640 g  Physical exam  Vitals:   07/30/23 1952 07/30/23 2303 07/31/23 0409 07/31/23 0807  BP: 131/74 120/76 116/72 109/68  Pulse: 93 70 73 75  Resp: 20 20 18 18   Temp: 98.3 F (36.8 C) (!) 97.5 F (36.4 C) (!) 97.5 F (36.4 C) 97.7 F (36.5 C)  TempSrc: Oral Oral  Oral  SpO2: 98% 98% 98% 97%  Weight:      Height:       General: alert, cooperative, and no distress Lochia: appropriate Uterine Fundus: firm Incision: N/A DVT Evaluation: No evidence of  DVT seen on physical exam.  Labs: Lab Results  Component Value Date   WBC 12.9 (H) 07/30/2023   HGB 9.1 (L) 07/30/2023   HCT 27.7 (L) 07/30/2023   MCV 78.9 (L) 07/30/2023   PLT 124 (L) 07/30/2023    Discharge instruction: per After Visit Summary.  Medications:  Allergies as of 07/31/2023   No Known Allergies      Medication List     STOP taking these medications    aspirin  EC 81 MG tablet   glyBURIDE  2.5 MG tablet Commonly known as: DIABETA    glyBURIDE  5 MG tablet Commonly known as: DIABETA    insulin  glargine 100 unit/mL Sopn Commonly known as: LANTUS        TAKE these medications    Blood Glucose Monitoring Suppl Devi 1 each by Does not apply route in the morning, at noon, and at bedtime. May substitute to any manufacturer covered by patient's insurance.   hydrocortisone  2.5 % rectal cream Commonly known as: Anusol -HC Place 1 Application rectally 2 (two) times daily.   prenatal vitamin w/FE, FA 29-1 MG Chew chewable tablet Chew 2 tablets by mouth daily at 12 noon.  Diet: carb modified diet  Activity: Advance as tolerated. Pelvic rest for 6 weeks.   Outpatient follow up:  Follow-up Information     Dominic, Alva Jewels, CNM Follow up in 2 week(s).   Specialty: Obstetrics and Gynecology Why: 2wk virtual and 6 wk in person visit/Mirena IUD insertion Contact information: [address] [place] [address] [phone]                   Postpartum contraception: IUD Mirena Rhogam Given postpartum: Rh positive Rubella vaccine given postpartum: immune Varicella vaccine given postpartum: immune TDaP given antepartum or postpartum: given antepartum    Newborn Delivery   Time head delivered: 07/29/2023 22:56:00 Birth date/time: 07/29/2023 22:58:00 Delivery type: Vaginal, Spontaneous      Baby Feeding: Breast  Disposition:home with mother  SIGNED:  Angelita Kendall, CNM 07/31/2023 12:07 PM

## 2023-08-13 ENCOUNTER — Ambulatory Visit (INDEPENDENT_AMBULATORY_CARE_PROVIDER_SITE_OTHER): Admitting: Licensed Practical Nurse

## 2023-08-13 DIAGNOSIS — Z1332 Encounter for screening for maternal depression: Secondary | ICD-10-CM

## 2023-08-13 DIAGNOSIS — E119 Type 2 diabetes mellitus without complications: Secondary | ICD-10-CM

## 2023-08-13 DIAGNOSIS — N719 Inflammatory disease of uterus, unspecified: Secondary | ICD-10-CM | POA: Insufficient documentation

## 2023-08-13 DIAGNOSIS — N853 Subinvolution of uterus: Secondary | ICD-10-CM

## 2023-08-13 MED ORDER — AMOXICILLIN-POT CLAVULANATE 875-125 MG PO TABS: 1.0000 | ORAL_TABLET | Freq: Two times a day (BID) | ORAL | 0 refills | Status: AC

## 2023-08-13 MED ORDER — METRONIDAZOLE 500 MG PO TABS: 500.0000 mg | ORAL_TABLET | Freq: Two times a day (BID) | ORAL | 0 refills | Status: AC
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
## 2023-08-18 ENCOUNTER — Ambulatory Visit
Admission: RE | Admit: 2023-08-18 | Discharge: 2023-08-18 | Disposition: A | Discharge status: Home / Self Care | Source: Ambulatory Visit | Attending: Licensed Practical Nurse | Admitting: Licensed Practical Nurse

## 2023-08-18 DIAGNOSIS — N853 Subinvolution of uterus: Secondary | ICD-10-CM

## 2023-08-30 ENCOUNTER — Ambulatory Visit: Payer: Self-pay | Admitting: Licensed Practical Nurse

## 2023-09-06 ENCOUNTER — Ambulatory Visit: Admitting: Licensed Practical Nurse

## 2023-09-14 ENCOUNTER — Ambulatory Visit: Admitting: Licensed Practical Nurse

## 2023-09-14 ENCOUNTER — Encounter: Payer: Self-pay | Admitting: Licensed Practical Nurse

## 2023-09-14 VITALS — BP 126/92 | HR 81 | Ht 61.0 in | Wt 192.9 lb

## 2023-09-14 DIAGNOSIS — Z3202 Encounter for pregnancy test, result negative: Secondary | ICD-10-CM | POA: Diagnosis not present

## 2023-09-14 DIAGNOSIS — Z30430 Encounter for insertion of intrauterine contraceptive device: Secondary | ICD-10-CM

## 2023-09-14 LAB — POCT URINE PREGNANCY: Preg Test, Ur: NEGATIVE

## 2023-09-14 MED ORDER — LEVONORGESTREL 20 MCG/DAY IU IUD
1.0000 | INTRAUTERINE_SYSTEM | Freq: Once | INTRAUTERINE | Status: AC
Start: 2023-09-14 — End: 2023-09-14
  Administered 2023-09-14: 1 via INTRAUTERINE
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
